# Patient Record
Sex: Female | Born: 1975 | Race: Black or African American | Hispanic: No | Marital: Single | Smoking: Former smoker
Health system: Southern US, Community
[De-identification: ages and names within clinical notes are randomized; demographics above are authoritative.]

## PROBLEM LIST (undated history)

## (undated) DIAGNOSIS — M797 Fibromyalgia: Secondary | ICD-10-CM

## (undated) DIAGNOSIS — F4310 Post-traumatic stress disorder, unspecified: Secondary | ICD-10-CM

## (undated) DIAGNOSIS — F419 Anxiety disorder, unspecified: Secondary | ICD-10-CM

## (undated) DIAGNOSIS — Z789 Other specified health status: Secondary | ICD-10-CM

## (undated) HISTORY — PX: BREAST SURGERY: SHX581

## (undated) HISTORY — PX: AUGMENTATION MAMMAPLASTY: SUR837

## (undated) HISTORY — DX: Other specified health status: Z78.9

## (undated) HISTORY — PX: TOOTH EXTRACTION: SUR596

## (undated) HISTORY — PX: PLACEMENT OF BREAST IMPLANTS: SHX6334

## (undated) HISTORY — DX: Anxiety disorder, unspecified: F41.9

## (undated) HISTORY — DX: Fibromyalgia: M79.7

---

## 2003-03-30 ENCOUNTER — Encounter: Admission: RE | Admit: 2003-03-30 | Discharge: 2003-03-30 | Payer: Self-pay | Admitting: Obstetrics & Gynecology

## 2004-03-02 ENCOUNTER — Encounter: Admission: RE | Admit: 2004-03-02 | Discharge: 2004-03-02 | Payer: Self-pay | Admitting: Obstetrics & Gynecology

## 2005-07-19 ENCOUNTER — Encounter: Admission: RE | Admit: 2005-07-19 | Discharge: 2005-07-19 | Payer: Self-pay | Admitting: General Surgery

## 2006-09-29 ENCOUNTER — Emergency Department (HOSPITAL_COMMUNITY): Admission: EM | Admit: 2006-09-29 | Discharge: 2006-09-29 | Payer: Self-pay | Admitting: Emergency Medicine

## 2012-08-10 ENCOUNTER — Encounter (HOSPITAL_COMMUNITY): Payer: Self-pay | Admitting: Emergency Medicine

## 2012-08-10 ENCOUNTER — Emergency Department (HOSPITAL_COMMUNITY): Payer: BC Managed Care – PPO

## 2012-08-10 ENCOUNTER — Inpatient Hospital Stay (HOSPITAL_COMMUNITY)
Admission: EM | Admit: 2012-08-10 | Discharge: 2012-08-13 | DRG: 089 | Disposition: A | Discharge status: Home / Self Care | Payer: BC Managed Care – PPO | Attending: Internal Medicine | Admitting: Internal Medicine

## 2012-08-10 DIAGNOSIS — E860 Dehydration: Secondary | ICD-10-CM | POA: Diagnosis present

## 2012-08-10 DIAGNOSIS — R55 Syncope and collapse: Secondary | ICD-10-CM | POA: Diagnosis present

## 2012-08-10 DIAGNOSIS — J189 Pneumonia, unspecified organism: Principal | ICD-10-CM

## 2012-08-10 DIAGNOSIS — I959 Hypotension, unspecified: Secondary | ICD-10-CM

## 2012-08-10 DIAGNOSIS — Z79899 Other long term (current) drug therapy: Secondary | ICD-10-CM

## 2012-08-10 DIAGNOSIS — E440 Moderate protein-calorie malnutrition: Secondary | ICD-10-CM | POA: Diagnosis present

## 2012-08-10 DIAGNOSIS — E876 Hypokalemia: Secondary | ICD-10-CM

## 2012-08-10 DIAGNOSIS — IMO0002 Reserved for concepts with insufficient information to code with codable children: Secondary | ICD-10-CM

## 2012-08-10 DIAGNOSIS — Z681 Body mass index (BMI) 19 or less, adult: Secondary | ICD-10-CM

## 2012-08-10 DIAGNOSIS — D649 Anemia, unspecified: Secondary | ICD-10-CM | POA: Diagnosis present

## 2012-08-10 DIAGNOSIS — F4310 Post-traumatic stress disorder, unspecified: Secondary | ICD-10-CM | POA: Diagnosis present

## 2012-08-10 HISTORY — DX: Post-traumatic stress disorder, unspecified: F43.10

## 2012-08-10 LAB — COMPREHENSIVE METABOLIC PANEL
ALT: 15 U/L (ref 0–35)
AST: 17 U/L (ref 0–37)
Albumin: 3.4 g/dL — ABNORMAL LOW (ref 3.5–5.2)
Alkaline Phosphatase: 63 U/L (ref 39–117)
BUN: 6 mg/dL (ref 6–23)
CO2: 27 mEq/L (ref 19–32)
Calcium: 9.3 mg/dL (ref 8.4–10.5)
Chloride: 99 mEq/L (ref 96–112)
Creatinine, Ser: 0.77 mg/dL (ref 0.50–1.10)
GFR calc Af Amer: >90 mL/min (ref >90)
GFR calc non Af Amer: >90 mL/min (ref >90)
Glucose, Bld: 122 mg/dL — ABNORMAL HIGH (ref 70–99)
Potassium: 3.2 mEq/L — ABNORMAL LOW (ref 3.5–5.1)
Sodium: 136 mEq/L (ref 135–145)
Total Bilirubin: 0.2 mg/dL — ABNORMAL LOW (ref 0.3–1.2)
Total Protein: 7.4 g/dL (ref 6.0–8.3)

## 2012-08-10 LAB — CBC WITH DIFFERENTIAL/PLATELET
Basophils Absolute: 0 10*3/uL (ref 0.0–0.1)
Basophils Relative: 0 % (ref 0–1)
Eosinophils Absolute: 0 10*3/uL (ref 0.0–0.7)
Eosinophils Relative: 0 % (ref 0–5)
HCT: 32.8 % — ABNORMAL LOW (ref 36.0–46.0)
Hemoglobin: 11.5 g/dL — ABNORMAL LOW (ref 12.0–15.0)
Lymphocytes Relative: 10 % — ABNORMAL LOW (ref 12–46)
Lymphs Abs: 0.8 10*3/uL (ref 0.7–4.0)
MCH: 29.3 pg (ref 26.0–34.0)
MCHC: 35.1 g/dL (ref 30.0–36.0)
MCV: 83.7 fL (ref 78.0–100.0)
Monocytes Absolute: 0.7 10*3/uL (ref 0.1–1.0)
Monocytes Relative: 9 % (ref 3–12)
Neutro Abs: 6.6 10*3/uL (ref 1.7–7.7)
Neutrophils Relative %: 81 % — ABNORMAL HIGH (ref 43–77)
Platelets: 268 10*3/uL (ref 150–400)
RBC: 3.92 MIL/uL (ref 3.87–5.11)
RDW: 12.2 % (ref 11.5–15.5)
WBC: 8.1 10*3/uL (ref 4.0–10.5)

## 2012-08-10 LAB — URINALYSIS, ROUTINE W REFLEX MICROSCOPIC
Bilirubin Urine: NEGATIVE
Glucose, UA: NEGATIVE mg/dL
Ketones, ur: NEGATIVE mg/dL
Nitrite: NEGATIVE
Protein, ur: 30 mg/dL — AB
Specific Gravity, Urine: 1.021 (ref 1.005–1.030)
Urobilinogen, UA: 1 mg/dL (ref 0.0–1.0)
pH: 7 (ref 5.0–8.0)

## 2012-08-10 LAB — URINE MICROSCOPIC-ADD ON

## 2012-08-10 LAB — D-DIMER, QUANTITATIVE: D-Dimer, Quant: 1.8 ug/mL-FEU — ABNORMAL HIGH (ref 0.00–0.48)

## 2012-08-10 LAB — TROPONIN I: Troponin I: <0.3 ng/mL (ref <0.30)

## 2012-08-10 LAB — PREGNANCY, URINE: Preg Test, Ur: NEGATIVE

## 2012-08-10 MED ORDER — SODIUM CHLORIDE 0.9 % IV BOLUS (SEPSIS)
1000.0000 mL | Freq: Once | INTRAVENOUS | Status: AC
  Administered 2012-08-10: 1000 mL via INTRAVENOUS

## 2012-08-10 MED ORDER — IBUPROFEN 800 MG PO TABS
800.0000 mg | ORAL_TABLET | Freq: Once | ORAL | Status: AC
  Administered 2012-08-10: 800 mg via ORAL
  Filled 2012-08-10: qty 1

## 2012-08-10 MED ORDER — DIPHENHYDRAMINE HCL 50 MG/ML IJ SOLN
25.0000 mg | Freq: Once | INTRAMUSCULAR | Status: AC
  Administered 2012-08-10: 25 mg via INTRAMUSCULAR
  Filled 2012-08-10: qty 1

## 2012-08-10 MED ORDER — AZITHROMYCIN 500 MG IV SOLR
500.0000 mg | INTRAVENOUS | Status: DC
  Administered 2012-08-10: 500 mg via INTRAVENOUS
  Filled 2012-08-10: qty 500

## 2012-08-10 MED ORDER — ONDANSETRON HCL 4 MG/2ML IJ SOLN
4.0000 mg | Freq: Once | INTRAMUSCULAR | Status: AC
  Administered 2012-08-10: 4 mg via INTRAVENOUS
  Filled 2012-08-10: qty 2

## 2012-08-10 MED ORDER — SODIUM CHLORIDE 0.9 % IV SOLN: INTRAVENOUS | Status: DC

## 2012-08-10 MED ORDER — CEFTRIAXONE SODIUM 250 MG IJ SOLR
250.0000 mg | Freq: Once | INTRAMUSCULAR | Status: AC
  Administered 2012-08-10: 250 mg via INTRAMUSCULAR
  Filled 2012-08-10: qty 250

## 2012-08-10 MED ORDER — IOHEXOL 350 MG/ML SOLN
100.0000 mL | Freq: Once | INTRAVENOUS | Status: AC | PRN
  Administered 2012-08-10: 100 mL via INTRAVENOUS

## 2012-08-10 MED ORDER — MORPHINE SULFATE 4 MG/ML IJ SOLN
4.0000 mg | Freq: Once | INTRAMUSCULAR | Status: AC
  Administered 2012-08-10: 4 mg via INTRAVENOUS
  Filled 2012-08-10: qty 1

## 2012-08-10 NOTE — ED Notes (Signed)
Pt ambulated out of the bathroom and RN and writer walked with a wheelchair behind pt, pt sts she was getting tired and could not walk anymore.  O2 sats at 97, HR at 113.

## 2012-08-10 NOTE — ED Notes (Signed)
Pt aware of the need for a urine sample. 

## 2012-08-10 NOTE — ED Notes (Addendum)
Patient c/o N/V, dry cough, fever/chills, syncopal episodes, anorexia, and right flank pain x 1 week.  Patient with h/o PTSD R/T 9/11 also reports anorexia that she hides from her husband.

## 2012-08-10 NOTE — ED Provider Notes (Signed)
Medical screening examination/treatment/procedure(s) were conducted as a shared visit with non-physician practitioner(s) and myself.  I personally evaluated the patient during the encounter  One week of chills, dry cough, fever nausea and sore throat. Decreased by mouth intake. Near syncopal episodes at home with generalized weakness. Bilateral flank pain from coughing. Lungs are clear to auscultation without wheezing. Abdomen is soft and nontender. No CVA tenderness. Infiltrates seen on chest x-ray. Treated as acquired pneumonia. Patient with limited to ambulate due to weakness. No hypoxia. Borderline blood pressures and heart rate. Will admit for observation.  Glynn Octave, MD 08/10/12 [phone]

## 2012-08-10 NOTE — ED Provider Notes (Signed)
History     CSN: [id number]  Arrival date & time 08/10/12  1710   First MD Initiated Contact with Patient 08/10/12 1717      Chief Complaint  Patient presents with  . Cough  . Loss of Consciousness  . Flank Pain    (Consider location/radiation/quality/duration/timing/severity/associated sxs/prior treatment) Patient is a 37 y.o. female presenting with cough, syncope, and flank pain. The history is provided by the patient and a relative. No language interpreter was used.  Cough Associated symptoms: chills, diaphoresis, fever and sore throat   Associated symptoms: no chest pain   Loss of Consciousness  Associated symptoms include diaphoresis, fever, nausea and vomiting ( dry heaves ). Pertinent negatives include chest pain and congestion.  Flank Pain Associated symptoms include chills, coughing, diaphoresis, a fever, nausea, a sore throat and vomiting ( dry heaves ). Pertinent negatives include no chest pain or congestion.  Pt is 36yo female c/o dry cough with mild sore throat and weakness x1wk.  Pt states she has not eaten much over the past 4 days due to nausea and reports 2-3 episodes of dry heaving today.  Pt was brought in by mother-in-law because pt had several episodes of near LOC. Pt is also c/o bilateral flank pain, R>L but denies abdominal pain or dysuria.  States she is currently on menstrual cycle.  Has tried children's Delsym and left over medication for strep throat.  States she has had 4 pills but no relief.  Denies hx of asthma or cardiac hx.  Denies hx of kidney stones.    Past Medical History  Diagnosis Date  . Post traumatic stress disorder (PTSD)     Past Surgical History  Procedure Laterality Date  . Cesarean section    . Tooth extraction      No family history on file.  History  Substance Use Topics  . Smoking status: Never Smoker   . Smokeless tobacco: Not on file  . Alcohol Use: Yes    OB History   Grav Para Term Preterm Abortions TAB SAB Ect Mult  Living                  Review of Systems  Constitutional: Positive for fever, chills, diaphoresis and appetite change ( anorexia x4 days).  HENT: Positive for sore throat. Negative for congestion, drooling, trouble swallowing and voice change.   Respiratory: Positive for cough.   Cardiovascular: Positive for syncope. Negative for chest pain.  Gastrointestinal: Positive for nausea and vomiting ( dry heaves ). Negative for diarrhea and constipation.  Genitourinary: Positive for flank pain.    Allergies  Review of patient's allergies indicates no known allergies.  Home Medications   Current Outpatient Rx  Name  Route  Sig  Dispense  Refill  . guaiFENesin (ROBITUSSIN) 100 MG/5ML liquid   Oral   Take 200 mg by mouth 3 (three) times daily as needed for cough.         Marland Kitchen PRESCRIPTION MEDICATION   Oral   Take 1 tablet by mouth daily. Antibiotic used for cough           BP 119/70  Pulse 113  Temp(Src) 100.8 F (38.2 C) (Oral)  Resp 14  SpO2 97%  LMP 08/08/2012  Physical Exam  Nursing note and vitals reviewed. Constitutional: She is oriented to person, place, and time. She appears well-developed and well-nourished.  HENT:  Head: Normocephalic and atraumatic.  Mouth/Throat: Oropharynx is clear and moist. No oropharyngeal exudate.  Eyes: Conjunctivae are  normal. No scleral icterus.  Neck: Normal range of motion. Neck supple. No JVD present. No tracheal deviation present. No thyromegaly present.  Cardiovascular: Regular rhythm and normal heart sounds.   Tachycardic   Pulmonary/Chest: Effort normal and breath sounds normal. No stridor. No respiratory distress. She has no wheezes. She has no rales. She exhibits tenderness ( left and right flank pain).  Abdominal: Soft. Bowel sounds are normal. She exhibits no distension and no mass. There is no tenderness. There is no rebound and no guarding.  Musculoskeletal: Normal range of motion. She exhibits no edema and no tenderness.   Lymphadenopathy:    She has no cervical adenopathy.  Neurological: She is alert and oriented to person, place, and time. She has normal reflexes.  Skin: Skin is warm and dry.    ED Course  Procedures (including critical care time)  Labs Reviewed  CBC WITH DIFFERENTIAL - Abnormal; Notable for the following:    Hemoglobin 11.5 (*)    HCT 32.8 (*)    Neutrophils Relative 81 (*)    Lymphocytes Relative 10 (*)    All other components within normal limits  COMPREHENSIVE METABOLIC PANEL - Abnormal; Notable for the following:    Potassium 3.2 (*)    Glucose, Bld 122 (*)    Albumin 3.4 (*)    Total Bilirubin 0.2 (*)    All other components within normal limits  URINALYSIS, ROUTINE W REFLEX MICROSCOPIC - Abnormal; Notable for the following:    APPearance CLOUDY (*)    Hgb urine dipstick LARGE (*)    Protein, ur 30 (*)    Leukocytes, UA SMALL (*)    All other components within normal limits  D-DIMER, QUANTITATIVE - Abnormal; Notable for the following:    D-Dimer, Quant 1.80 (*)    All other components within normal limits  TROPONIN I  PREGNANCY, URINE  URINE MICROSCOPIC-ADD ON   Dg Chest 2 View  08/10/2012  *RADIOLOGY REPORT*  Clinical Data: Cough and bodyaches.  CHEST - 2 VIEW  Comparison: None.  Findings: Midline trachea.  Normal heart size and mediastinal contours. No pleural effusion or pneumothorax.  Vague increased density projects of the left midlung on the frontal view.  This may correspond to a subtle increased density anterior to the left major fissure on the lateral view.  A nodular density projects over the right midlung and measures 1.5 cm. Numerous leads and wires project over the chest.  IMPRESSION:  1.  1.5 cm nodular density projecting over the right midlung. Suspicious for pulmonary nodule.  CT is pending. 2.  Vague increased density projecting over the left midlung on the frontal view.  Suspicious for left upper lobe infection. Asymmetric overlying breast tissue is felt  less likely.  This will also be further characterized with chest CT.   Original Report Authenticated By: Jeronimo Greaves, M.D.    Ct Angio Chest W/cm &/or Wo Cm  08/10/2012  *RADIOLOGY REPORT*  Clinical Data: Chest pain and cough.  Elevated D-dimer.  CT ANGIOGRAPHY CHEST  Technique:  Multidetector CT imaging of the chest using the standard protocol during bolus administration of intravenous contrast. Multiplanar reconstructed images including MIPs were obtained and reviewed to evaluate the vascular anatomy.  Contrast: OMNIPAQUE IOHEXOL 350 MG/ML SOLN  Comparison: Chest x-ray 08/10/2012  Findings: The chest wall is unremarkable.  The bony thorax is intact.  The heart is normal in size.  No pericardial effusion.  No mediastinal or hilar lymphadenopathy.  Small scattered nodes are noted.  The aorta is normal in caliber.  The esophagus is normal.  The pulmonary arterial tree is fairly well opacified.  No filling defects to suggest pulmonary emboli.  Examination of the lung parenchyma demonstrates bilateral airspace opacities, most likely pneumonia.  The left upper lobe is the most significant and there are prominent air bronchograms.  The other infiltrates are patchy.  No pleural effusion or pulmonary edema. No bronchiectasis.  The upper abdomen is unremarkable.  IMPRESSION:  1.  No CT findings for pulmonary embolism. 2.  Bilateral infiltrates most prominent in the left upper lobe. Recommend post-treatment follow-up chest x-ray to make sure these resolve.   Original Report Authenticated By: Rudie Meyer, M.D.      Date: 08/10/2012  Rate: 115  Rhythm: normal sinus rhythm  QRS Axis: normal  Intervals: normal  ST/T Wave abnormalities: normal  Conduction Disutrbances:none  Narrative Interpretation:   Old EKG Reviewed: none available    1. Left upper lobe pneumonia       MDM  Pt c/o dry cough associated with n/v and anorexia x1wk.  States she has felt very weak and c/o bilateral flank pain.  Has  tried medication she was given for strep throat and OTC children's cough syrup w/o relief.  Denies asthma or other medical problems.  LMP-currently on cycle. Not on birth control.  Pt nervous about giving urine sample due to being on menstrual cycle. Explained to pt importance of urine sample to help r/o bladder or kidney infection.  Will obtain labs and CXR.   Started on zofran, morphine, fluids.   Results: D-dimer 1.8, CBC- hgb 11.5, hct 32.8  ECG: nl  Preg: neg  8:30 PM Just went to check on pt, noticed 1 hive on right cheek.  Pt states she just returned from CT and had IV contrast.  No signs of anaphylaxis.  Breathing normally. NAD.   8:30 PM Explained to pt that she has pneumonia.  We will start antibiotic tx here and make sure she can ambulate w/o O2 dropping too low.  Explain to pt importance of eating and completely antibiotic tx.  Informed pt she also has anemia and should set up care with PCP for further evaluation and tx.   8:30 PM RN informed me pt was unable to walk more than 4 steps for ambulatory O2 due to weakness.  Pt never became hypoxic.  Advised RN to give pt juice and crackers.  Pt asked if husband could bring her dinner.  Advised pt she may have whatever she is able to keep down.  Will check on pt after she has received antibiotics, fluids, and some food.    9:46 PM Pt resting in bed with lights turned out stating she doesn't feel well.  BP is 87/65, c/o IV bothering her.  State she has had some crackers.  Sandwich is at bedside, pt states she will try to take a few small bites of the sandwich.    Will discuss with Dr. Manus Gunning and have RN recheck IV.   Will admit pt due to hypotension, tachycardia, and lethargy for fluids and IV antibiotics.   Discussed with pt who agreed to be admitted for additional tx.   Dr. Manus Gunning consulted Dr. Eben Burow who agreed to admit pt to Team 8-med surg.    Junius Finner, PA-C 08/10/12 2316

## 2012-08-10 NOTE — H&P (Signed)
[facility] AD[facility] NOTE   History and Physical  Meredith Wood BMW:[id number] DOB: [birth year] DOA: 08/10/2012  Referring physician: EDP/ Dr. Manus Gunning PCP: No primary provider on file.   Chief Complaint: cough  HPI: 37 year old woman with no significant past medical history except PTSD presents to the ED with chief complaint of cough and fever x 4 days. Patient reports having dry cough, associated with mild sore throat. She also reports loss of appetite, nausea but denies any vomiting. Patient also reports fevers but she did not take her temperature. Patient has headaches, body aches and has not been able to eat anything due to decreased appetite since last 4 days. Fevers have been associated with chills and patient has been basically covering herself with blankets all the time. Today she just felt that she needs help as she was not getting better and decided to come to the ER.  She also reports bilateral flank pain  that gets worse with coughing, denies any dysuria, abdominal pain, urgency or frequency. She reports having episodes of near syncope/dizziness due to weakness at home but denies loss of conscious or head trauma. Patient denies any sick contacts.  CT chest was found to be positive for upper lobe pneumonia. Patient does have increased neutrophils on CBC done in ER. Hospitalists team was asked to admit the patient for pneumonia.   Review of Systems:  Constitutional: Positive for fever, chills, diaphoresis and decrease in appetite change HENT: Positive for sore throat. Negative for congestion, drooling, trouble swallowing and voice change.  Respiratory: Positive for cough.  Cardiovascular: Positive for near syncope. Negative for chest pain.  Gastrointestinal: Positive for nausea . Negative for vomiting, diarrhea and constipation.  Genitourinary: Positive for flank pain.    Past Medical History  Diagnosis Date  . Post traumatic stress disorder (PTSD)     Past  Surgical History  Procedure Laterality Date  . Cesarean section    . Tooth extraction      Social History:  reports that she has never smoked. She does not have any smokeless tobacco history on file. She reports that  drinks alcohol. She reports that she does not use illicit drugs.  No Known Allergies  No family history on file. Noncontributory  Prior to Admission medications   Medication Sig Start Date End Date Taking? Authorizing Provider  guaiFENesin (ROBITUSSIN) 100 MG/5ML liquid Take 200 mg by mouth 3 (three) times daily as needed for cough.   Yes Historical Provider, MD  PRESCRIPTION MEDICATION Take 1 tablet by mouth daily. Antibiotic used for cough   Yes Historical Provider, MD   Physical Exam: Filed Vitals:   08/10/12 2017 08/10/12 2100 08/10/12 2200 08/10/12 2206  BP:  87/65 92/49 94/54   Pulse: 113 102 94   Temp:    98.8 F (37.1 C)  TempSrc:    Oral  Resp:  20 22 27   SpO2: 97% 99% 99% 99%   General: Patient is cachectic and has a sick look but otherwise alert and no acute distress.  HENT: Normocephalic and atraumatic, PERRLA, oropharynx is clear but dry mucous membranes with sunken eyes. No oropharyngeal exudate.  Neck: Normal range of motion. Neck supple. No JVD present.  Cardiovascular: Tachycardic,  Regular rhythm and normal heart sounds.  Pulmonary/Chest: Effort normal and breath sounds normal. No stridor, wheezes, or rales.  Abdominal: Soft, non distended non tender, positive bowel sounds, no rebound and no guarding, mildly tender to palpation in right and left flank Musculoskeletal: Normal range of motion.  No edema or cyanosis Lymphadenopathy: She has no cervical adenopathy.  Neurological: She is alert and oriented to person, place, and time. She has normal reflexes.  Skin: Skin is warm and dry.     Wt Readings from Last 3 Encounters:  No data found for Wt    Labs on Admission:  Basic Metabolic Panel:  Recent Labs Lab 08/10/12 1755  NA 136  K 3.2*   CL 99  CO2 27  GLUCOSE 122*  BUN 6  CREATININE 0.77  CALCIUM 9.3    Liver Function Tests:  Recent Labs Lab 08/10/12 1755  AST 17  ALT 15  ALKPHOS 63  BILITOT 0.2*  PROT 7.4  ALBUMIN 3.4*    CBC:  Recent Labs Lab 08/10/12 1755  WBC 8.1  NEUTROABS 6.6  HGB 11.5*  HCT 32.8*  MCV 83.7  PLT 268    Cardiac Enzymes:  Recent Labs Lab 08/10/12 1755  TROPONINI <0.30      Radiological Exams on Admission: Dg Chest 2 View  08/10/2012  *RADIOLOGY REPORT*  Clinical Data: Cough and bodyaches.  CHEST - 2 VIEW  Comparison: None.  Findings: Midline trachea.  Normal heart size and mediastinal contours. No pleural effusion or pneumothorax.  Vague increased density projects of the left midlung on the frontal view.  This may correspond to a subtle increased density anterior to the left major fissure on the lateral view.  A nodular density projects over the right midlung and measures 1.5 cm. Numerous leads and wires project over the chest.  IMPRESSION:  1.  1.5 cm nodular density projecting over the right midlung. Suspicious for pulmonary nodule.  CT is pending. 2.  Vague increased density projecting over the left midlung on the frontal view.  Suspicious for left upper lobe infection. Asymmetric overlying breast tissue is felt less likely.  This will also be further characterized with chest CT.   Original Report Authenticated By: Jeronimo Greaves, M.D.    Ct Angio Chest W/cm &/or Wo Cm  08/10/2012  *RADIOLOGY REPORT*  Clinical Data: Chest pain and cough.  Elevated D-dimer.  CT ANGIOGRAPHY CHEST  Technique:  Multidetector CT imaging of the chest using the standard protocol during bolus administration of intravenous contrast. Multiplanar reconstructed images including MIPs were obtained and reviewed to evaluate the vascular anatomy.  Contrast: OMNIPAQUE IOHEXOL 350 MG/ML SOLN  Comparison: Chest x-ray 08/10/2012  Findings: The chest wall is unremarkable.  The bony thorax is intact.  The  heart is normal in size.  No pericardial effusion.  No mediastinal or hilar lymphadenopathy.  Small scattered nodes are noted.  The aorta is normal in caliber.  The esophagus is normal.  The pulmonary arterial tree is fairly well opacified.  No filling defects to suggest pulmonary emboli.  Examination of the lung parenchyma demonstrates bilateral airspace opacities, most likely pneumonia.  The left upper lobe is the most significant and there are prominent air bronchograms.  The other infiltrates are patchy.  No pleural effusion or pulmonary edema. No bronchiectasis.  The upper abdomen is unremarkable.  IMPRESSION:  1.  No CT findings for pulmonary embolism. 2.  Bilateral infiltrates most prominent in the left upper lobe. Recommend post-treatment follow-up chest x-ray to make sure these resolve.   Original Report Authenticated By: Rudie Meyer, M.D.     EKG: sinus tachycardia, HR~110 bpm, no acute ST- T wave changes   Active Problems:   CAP (community acquired pneumonia)   Hypotension   Hypokalemia   Assessment/Plan # CAP/flu:  Patient presents with cough and bilateral flank pain. She was noted to have low grade temps with tachycardia  in the ED. Labs show normal WBC with mild left shift. CXR and CT shows bilateral infiltrates, most prominent in the LUL. PE was ruled out with negative CT- angio. Signs and symptoms are somewhat concerning for a flu(headaches, body ache and fatigue) - Admit to med- surg bed. - Obtain sputum cultures, urine for strep and legionella antigen - Treat with ceftriaxone and azithromycin - Would repeat a follow up CXR in 6- 8 weeks to make sure resolution of PNA - flu pcr - tamiflu  # Near syncope: likely 2/2 dehydration from decreased Po intake in the setting of infection( CAP)Patient reports having several episodes of near syncope. Her BP is running soft with systolic's in 90's.  - Orthostatics - Hydrate with IVF  # HYpokalemia: likely 2/2 decreased intake and  possible contraction alkalosis. - Replete K.  - BMET in AM  # Normocytic anemia: H and H stable. Continue to monitor.   # DVT: Lovenox  Code Status: Full Family Communication: Patient and family at the bedside were updated on the plan of care.  Disposition Plan/Anticipated LOS: Discharge home likely in 1-2 days.  Time spent: 70 minutes  Lars Mage, MD  [facility] Team 5  If 7PM-7AM, please contact night-coverage at [email or web address], password [facility] 08/10/2012, 10:52 PM

## 2012-08-10 NOTE — ED Notes (Signed)
Patient given PB and crackers, juice and water. Will con't to monitor.

## 2012-08-10 NOTE — ED Notes (Signed)
Report called, room is not ready, estimated 15 minutes. RN will call when ready

## 2012-08-11 DIAGNOSIS — I959 Hypotension, unspecified: Secondary | ICD-10-CM

## 2012-08-11 DIAGNOSIS — A419 Sepsis, unspecified organism: Secondary | ICD-10-CM

## 2012-08-11 DIAGNOSIS — E876 Hypokalemia: Secondary | ICD-10-CM

## 2012-08-11 LAB — BASIC METABOLIC PANEL
BUN: 5 mg/dL — ABNORMAL LOW (ref 6–23)
Chloride: 107 mEq/L (ref 96–112)
Creatinine, Ser: 0.8 mg/dL (ref 0.50–1.10)
GFR calc Af Amer: >90 mL/min (ref >90)
Glucose, Bld: 101 mg/dL — ABNORMAL HIGH (ref 70–99)

## 2012-08-11 LAB — STREP PNEUMONIAE URINARY ANTIGEN: Strep Pneumo Urinary Antigen: NEGATIVE

## 2012-08-11 LAB — EXPECTORATED SPUTUM ASSESSMENT W GRAM STAIN, RFLX TO RESP C: Special Requests: NORMAL

## 2012-08-11 LAB — LEGIONELLA ANTIGEN, URINE: Legionella Antigen, Urine: NEGATIVE

## 2012-08-11 LAB — INFLUENZA PANEL BY PCR (TYPE A & B): H1N1 flu by pcr: NOT DETECTED

## 2012-08-11 MED ORDER — IPRATROPIUM BROMIDE 0.02 % IN SOLN
0.5000 mg | Freq: Four times a day (QID) | RESPIRATORY_TRACT | Status: DC
  Administered 2012-08-11 – 2012-08-12 (×3): 0.5 mg via RESPIRATORY_TRACT
  Filled 2012-08-11 (×4): qty 2.5

## 2012-08-11 MED ORDER — ONDANSETRON HCL 4 MG PO TABS: 4.0000 mg | ORAL_TABLET | Freq: Four times a day (QID) | ORAL | Status: DC | PRN

## 2012-08-11 MED ORDER — ENSURE COMPLETE PO LIQD
237.0000 mL | Freq: Two times a day (BID) | ORAL | Status: DC
  Administered 2012-08-11 – 2012-08-12 (×3): 237 mL via ORAL

## 2012-08-11 MED ORDER — OSELTAMIVIR PHOSPHATE 75 MG PO CAPS
75.0000 mg | ORAL_CAPSULE | Freq: Two times a day (BID) | ORAL | Status: DC
  Administered 2012-08-11 (×2): 75 mg via ORAL
  Filled 2012-08-11 (×3): qty 1

## 2012-08-11 MED ORDER — ALBUTEROL SULFATE (5 MG/ML) 0.5% IN NEBU
2.5000 mg | INHALATION_SOLUTION | Freq: Four times a day (QID) | RESPIRATORY_TRACT | Status: DC
  Administered 2012-08-11 – 2012-08-12 (×3): 2.5 mg via RESPIRATORY_TRACT
  Filled 2012-08-11 (×4): qty 0.5

## 2012-08-11 MED ORDER — DEXTROSE 5 % IV SOLN
1.0000 g | Freq: Every day | INTRAVENOUS | Status: DC
  Administered 2012-08-11 – 2012-08-12 (×2): 1 g via INTRAVENOUS
  Filled 2012-08-11 (×3): qty 10

## 2012-08-11 MED ORDER — ENOXAPARIN SODIUM 40 MG/0.4ML ~~LOC~~ SOLN
40.0000 mg | SUBCUTANEOUS | Status: DC
  Administered 2012-08-11 – 2012-08-13 (×3): 40 mg via SUBCUTANEOUS
  Filled 2012-08-11 (×3): qty 0.4

## 2012-08-11 MED ORDER — AZITHROMYCIN 500 MG IV SOLR
500.0000 mg | Freq: Every day | INTRAVENOUS | Status: DC
  Administered 2012-08-11: 500 mg via INTRAVENOUS
  Filled 2012-08-11: qty 500

## 2012-08-11 MED ORDER — POTASSIUM CHLORIDE CRYS ER 20 MEQ PO TBCR
40.0000 meq | EXTENDED_RELEASE_TABLET | Freq: Two times a day (BID) | ORAL | Status: AC
  Administered 2012-08-11 (×2): 40 meq via ORAL
  Filled 2012-08-11 (×2): qty 2

## 2012-08-11 MED ORDER — ONDANSETRON HCL 4 MG/2ML IJ SOLN
4.0000 mg | Freq: Four times a day (QID) | INTRAMUSCULAR | Status: DC | PRN
  Administered 2012-08-11 – 2012-08-13 (×2): 4 mg via INTRAVENOUS
  Filled 2012-08-11 (×2): qty 2

## 2012-08-11 MED ORDER — IBUPROFEN 600 MG PO TABS
600.0000 mg | ORAL_TABLET | Freq: Three times a day (TID) | ORAL | Status: DC | PRN
  Administered 2012-08-11 – 2012-08-13 (×5): 600 mg via ORAL
  Filled 2012-08-11 (×8): qty 1

## 2012-08-11 MED ORDER — GUAIFENESIN-DM 100-10 MG/5ML PO SYRP
5.0000 mL | ORAL_SOLUTION | ORAL | Status: DC | PRN
  Administered 2012-08-11 – 2012-08-13 (×7): 5 mL via ORAL
  Filled 2012-08-11 (×7): qty 10

## 2012-08-11 MED ORDER — DM-GUAIFENESIN ER 30-600 MG PO TB12
1.0000 | ORAL_TABLET | Freq: Two times a day (BID) | ORAL | Status: DC
  Administered 2012-08-11 – 2012-08-13 (×5): 1 via ORAL
  Filled 2012-08-11 (×6): qty 1

## 2012-08-11 MED ORDER — ADULT MULTIVITAMIN W/MINERALS CH
1.0000 | ORAL_TABLET | Freq: Every day | ORAL | Status: DC
  Administered 2012-08-11 – 2012-08-13 (×3): 1 via ORAL
  Filled 2012-08-11 (×3): qty 1

## 2012-08-11 MED ORDER — SODIUM CHLORIDE 0.9 % IV SOLN
INTRAVENOUS | Status: DC
  Administered 2012-08-11 – 2012-08-13 (×8): via INTRAVENOUS

## 2012-08-11 NOTE — Progress Notes (Signed)
[facility] PROGRESS NOTE  Meredith Wood EAV:[id number] DOB: 12-19-1975 DOA: 08/10/2012 PCP: No primary provider on file.  Assessment/Plan: #CAP (community acquired pneumonia) with sepsis sydrome -will continue empiric rocephin and zithromax and continue volume resuscitation with ivf -influenza PCR neg, will D/Wood Tamiflu -strep pneumo antigen is neg, follow pending legionella antigen and sputum culture -will obtain HIV test -follow and obtain blood culture if further fever #Hypotension -likely secondary to #1 -see treat as above #Hypokalemia -likely secondary to decreased po intake -resolved # Near syncope: - likely 2/2 dehydration/orthostasis/hypotension from decreased Po intake in the setting of infectionPatient reports having several episodes of near syncope.  - Hydrating with IVF as above  # Normocytic anemia: H and H stable. Continue to monitor.    Code Status: full Family Communication: husband at bedside  Disposition Plan: to home when medically stable   Consultants:  none  Procedures:  none  Antibiotics:    HPI/Subjective: Still with cough and feels sore all over  Objective: Filed Vitals:   08/11/12 0104 08/11/12 0105 08/11/12 0108 08/11/12 0549  BP: 84/50 84/52 101/62 75/48  Pulse: 84 80 78 84  Temp:    97.4 F (36.3 Wood)  TempSrc:    Oral  Resp:    16  Height:      Weight:      SpO2:    100%    Intake/Output Summary (Last 24 hours) at 08/11/12 1109 Last data filed at 08/11/12 1022  Gross per 24 hour  Intake      0 ml  Output    550 ml  Net   -550 ml   Filed Weights   08/11/12 0103  Weight: 45.7 kg (100 lb 12 oz)    Exam:   General: thin appearing young female in NAD  Cardiovascular: RRR  Respiratory: CTAB  Abdomen: soft +BS NT/ND  Extremities: No cyanosis, no edema   Data Reviewed: Basic Metabolic Panel:  Recent Labs Lab 08/10/12 1755 08/11/12 0442  NA 136 139  K 3.2* 3.6  CL 99 107  CO2 27 27  GLUCOSE  122* 101*  BUN 6 5*  CREATININE 0.77 0.80  CALCIUM 9.3 8.1*   Liver Function Tests:  Recent Labs Lab 08/10/12 1755  AST 17  ALT 15  ALKPHOS 63  BILITOT 0.2*  PROT 7.4  ALBUMIN 3.4*   No results found for this basename: LIPASE, AMYLASE,  in the last 168 hours No results found for this basename: AMMONIA,  in the last 168 hours CBC:  Recent Labs Lab 08/10/12 1755  WBC 8.1  NEUTROABS 6.6  HGB 11.5*  HCT 32.8*  MCV 83.7  PLT 268   Cardiac Enzymes:  Recent Labs Lab 08/10/12 1755  TROPONINI <0.30   BNP (last 3 results) No results found for this basename: PROBNP,  in the last 8760 hours CBG: No results found for this basename: GLUCAP,  in the last 168 hours  Recent Results (from the past 240 hour(s))  CULTURE, EXPECTORATED SPUTUM-ASSESSMENT     Status: None   Collection Time    08/11/12 10:15 AM      Result Value Range Status   Specimen Description SPUTUM   Final   Special Requests Normal   Final   Sputum evaluation     Final   Value: THIS SPECIMEN IS ACCEPTABLE. RESPIRATORY CULTURE REPORT TO FOLLOW.   Report Status 08/11/2012 FINAL   Final     Studies: Dg Chest 2 View  08/10/2012  *RADIOLOGY REPORT*  Clinical Data: Cough and bodyaches.  CHEST - 2 VIEW  Comparison: None.  Findings: Midline trachea.  Normal heart size and mediastinal contours. No pleural effusion or pneumothorax.  Vague increased density projects of the left midlung on the frontal view.  This may correspond to a subtle increased density anterior to the left major fissure on the lateral view.  A nodular density projects over the right midlung and measures 1.5 cm. Numerous leads and wires project over the chest.  IMPRESSION:  1.  1.5 cm nodular density projecting over the right midlung. Suspicious for pulmonary nodule.  CT is pending. 2.  Vague increased density projecting over the left midlung on the frontal view.  Suspicious for left upper lobe infection. Asymmetric overlying breast tissue is felt less  likely.  This will also be further characterized with chest CT.   Original Report Authenticated By: Meredith Wood, M.D.    Ct Angio Chest W/cm &/or Wo Cm  08/10/2012  *RADIOLOGY REPORT*  Clinical Data: Chest pain and cough.  Elevated D-dimer.  CT ANGIOGRAPHY CHEST  Technique:  Multidetector CT imaging of the chest using the standard protocol during bolus administration of intravenous contrast. Multiplanar reconstructed images including MIPs were obtained and reviewed to evaluate the vascular anatomy.  Contrast: OMNIPAQUE IOHEXOL 350 MG/ML SOLN  Comparison: Chest x-ray 08/10/2012  Findings: The chest wall is unremarkable.  The bony thorax is intact.  The heart is normal in size.  No pericardial effusion.  No mediastinal or hilar lymphadenopathy.  Small scattered nodes are noted.  The aorta is normal in caliber.  The esophagus is normal.  The pulmonary arterial tree is fairly well opacified.  No filling defects to suggest pulmonary emboli.  Examination of the lung parenchyma demonstrates bilateral airspace opacities, most likely pneumonia.  The left upper lobe is the most significant and there are prominent air bronchograms.  The other infiltrates are patchy.  No pleural effusion or pulmonary edema. No bronchiectasis.  The upper abdomen is unremarkable.  IMPRESSION:  1.  No CT findings for pulmonary embolism. 2.  Bilateral infiltrates most prominent in the left upper lobe. Recommend post-treatment follow-up chest x-ray to make sure these resolve.   Original Report Authenticated By: Meredith Wood, M.D.     Scheduled Meds: . albuterol  2.5 mg Nebulization Q6H  . azithromycin  500 mg Intravenous QHS  . cefTRIAXone (ROCEPHIN)  IV  1 g Intravenous QHS  . enoxaparin (LOVENOX) injection  40 mg Subcutaneous Q24H  . ipratropium  0.5 mg Nebulization Q6H  . oseltamivir  75 mg Oral BID   Continuous Infusions: . sodium chloride 150 mL/hr at 08/11/12 4098    Active Problems:   CAP (community acquired  pneumonia)   Hypotension   Hypokalemia    Time spent: 35    Meredith Wood  [facility] Pager [phone]. If 7PM-7AM, please contact night-coverage at [email or web address], password [facility] 08/11/2012, 11:09 AM  LOS: 1 day

## 2012-08-11 NOTE — Progress Notes (Signed)
INITIAL NUTRITION ASSESSMENT  Pt meets criteria for severe MALNUTRITION in the context of acute illness as evidenced by <50% estimated energy intake with 7.4% weight loss in the past week per pt report.  DOCUMENTATION CODES Per approved criteria  -Severe malnutrition in the context of acute illness or injury -Underweight   INTERVENTION: - Vanilla Ensure Complete BID - Discussed importance of balanced diet for general health. Encouraged pt to consume adequate protein intake and discussed potential meal/snack options. Pt expressed understanding and ordered additional protein for lunch. - Multivitamin 1 tablet PO daily - Will continue to monitor   NUTRITION DIAGNOSIS: Predicted suboptimal energy intake related to history of inadequate oral intake r/t picky eating as evidenced by pt statement.   Goal: 1. Resolution of nausea 2. Pt to consume >90% of meals/supplements  Monitor:  Weights, labs, intake nausea  Reason for Assessment: Nutrition risk   37 y.o. female  Admitting Dx: Cough  ASSESSMENT: Pt admitted with cough and fever for 4 days with associated loss of appetite and nausea. Met with pt who reports she has only been able to consume Gatorade and water for the past week causing an 8 pound unintended weight loss. Pt reports before then she was mainly only eating 1 meal/day due to being a picky eater and states her other meals are "spotty". Pt states her boyfriend does not think she eats enough but she thinks she does and states she likes to snack on fruit throughout the day. Pt c/o still having nausea but less dry heaving. Pt was able to eat some toast and yogurt this morning.   Height: Ht Readings from Last 1 Encounters:  08/11/12 5\' 4"  (1.626 m)    Weight: Wt Readings from Last 1 Encounters:  08/11/12 100 lb 12 oz (45.7 kg)    Ideal Body Weight: 120 lb  % Ideal Body Weight: 83  Wt Readings from Last 10 Encounters:  08/11/12 100 lb 12 oz (45.7 kg)    Usual Body  Weight: 108 lb  % Usual Body Weight: 92  BMI:  Body mass index is 17.29 kg/(m^2). Underweight  Estimated Nutritional Needs: Kcal: 1350-1600 Protein: 55-70g Fluid: 1.3-1.6L/day  Skin: Intact   Diet Order: General  EDUCATION NEEDS: -No education needs identified at this time   Intake/Output Summary (Last 24 hours) at 08/11/12 1059 Last data filed at 08/11/12 1022  Gross per 24 hour  Intake      0 ml  Output    550 ml  Net   -550 ml    Last BM: 4/21  Labs:   Recent Labs Lab 08/10/12 1755 08/11/12 0442  NA 136 139  K 3.2* 3.6  CL 99 107  CO2 27 27  BUN 6 5*  CREATININE 0.77 0.80  CALCIUM 9.3 8.1*  GLUCOSE 122* 101*    CBG (last 3)  No results found for this basename: GLUCAP,  in the last 72 hours  Scheduled Meds: . albuterol  2.5 mg Nebulization Q6H  . azithromycin  500 mg Intravenous QHS  . cefTRIAXone (ROCEPHIN)  IV  1 g Intravenous QHS  . enoxaparin (LOVENOX) injection  40 mg Subcutaneous Q24H  . ipratropium  0.5 mg Nebulization Q6H  . oseltamivir  75 mg Oral BID    Continuous Infusions: . sodium chloride 150 mL/hr at 08/11/12 5621    Past Medical History  Diagnosis Date  . Post traumatic stress disorder (PTSD)     Past Surgical History  Procedure Laterality Date  . Cesarean section    .  Tooth extraction      Levon Hedger MS, RD, LDN [phone] Pager [phone] After Hours Pager

## 2012-08-12 ENCOUNTER — Encounter (HOSPITAL_COMMUNITY): Payer: Self-pay

## 2012-08-12 LAB — CBC
Hemoglobin: 9.2 g/dL — ABNORMAL LOW (ref 12.0–15.0)
MCH: 29.4 pg (ref 26.0–34.0)
RBC: 3.13 MIL/uL — ABNORMAL LOW (ref 3.87–5.11)
WBC: 5.8 10*3/uL (ref 4.0–10.5)

## 2012-08-12 MED ORDER — IPRATROPIUM BROMIDE 0.02 % IN SOLN: 0.5000 mg | Freq: Four times a day (QID) | RESPIRATORY_TRACT | Status: DC | PRN

## 2012-08-12 MED ORDER — AZITHROMYCIN 500 MG PO TABS
500.0000 mg | ORAL_TABLET | Freq: Every day | ORAL | Status: DC
  Administered 2012-08-12: 500 mg via ORAL
  Filled 2012-08-12 (×2): qty 1

## 2012-08-12 MED ORDER — ALBUTEROL SULFATE (5 MG/ML) 0.5% IN NEBU: 2.5000 mg | INHALATION_SOLUTION | Freq: Four times a day (QID) | RESPIRATORY_TRACT | Status: DC | PRN

## 2012-08-12 NOTE — Progress Notes (Signed)
PHARMACIST - PHYSICIAN COMMUNICATION [facility] CONCERNING: Antibiotic IV to Oral Route Change Policy  RECOMMENDATION: This patient is receiving Zithromax by the intravenous route.  Based on criteria approved by the Pharmacy and Therapeutics Committee, the antibiotic(s) is/are being converted to the equivalent oral dose form(s).   DESCRIPTION: These criteria include:  Patient being treated for a respiratory tract infection, urinary tract infection, or cellulitis  The patient is not neutropenic and does not exhibit a GI malabsorption state  The patient is eating (either orally or via tube) and/or has been taking other orally administered medications for a least 24 hours  The patient is improving clinically and has a Tmax < 100.5  If you have questions about this conversion, please contact the Pharmacy Department  []   [phone] )  Meredith Wood []   [phone] )  Meredith Wood  []   [phone] )  [facility] [x]   [phone])  [facility]    Hessie Knows, PharmD, [place] Pager [phone] 08/12/2012 9:16 AM

## 2012-08-12 NOTE — Progress Notes (Deleted)
This patient is receiving IV Protonix. Based on criteria approved by the Pharmacy and Therapeutics Committee, this medication is being converted to the equivalent oral dose form. These criteria include:   . The patient is eating (either orally or per tube) and/or has been taking other orally administered medications for at least 24 hours.  . This patient has no evidence of active gastrointestinal bleeding or impaired GI absorption (gastrectomy, short bowel, patient on TNA or NPO).   If you have questions about this conversion, please contact the pharmacy department.  Berkley Harvey, [facility] 08/12/2012 9:15 AM

## 2012-08-12 NOTE — Progress Notes (Signed)
[facility] PROGRESS NOTE  [name]:[id number] DOB: 01/31/1976 DOA: 08/10/2012 PCP: No primary provider on file.  Assessment/Plan: #CAP (community acquired pneumonia) with sepsis sydrome -will continue empiric rocephin and zithromax and continue ivf BP soft but improved -influenza PCR neg, will D/C Tamiflu -strep pneumo antigen is neg, follow pending legionella antigen and sputum culture - HIV test neg -blood culture obtained last pm pending #Hypotension -likely secondary to #1 -improving with ivf #Hypokalemia -likely secondary to decreased po intake -resolved # Near syncope: - likely 2/2 dehydration/orthostasis/hypotension from decreased Po intake in the setting of infectionPatient reports having several episodes of near syncope.  - Hydrating with IVF as above  # Normocytic anemia: H and H stable. Continue to monitor.    Code Status: full Family Communication: husband at bedside  Disposition Plan: to home when medically stable   Consultants:  none  Procedures:  none  Antibiotics:    HPI/Subjective: Still with cough, states soreness improving  Objective: Filed Vitals:   08/11/12 2111 08/12/12 0142 08/12/12 0656 08/12/12 1613  BP: 95/60  100/60 103/70  Pulse: 82  96 77  Temp: 98.4 F (36.9 C)  98.3 F (36.8 C) 98.9 F (37.2 C)  TempSrc: Oral  Oral Oral  Resp: 18 18 18 16   Height:      Weight:      SpO2: 100%  99% 100%    Intake/Output Summary (Last 24 hours) at 08/12/12 2113 Last data filed at 08/12/12 1616  Gross per 24 hour  Intake   4390 ml  Output      0 ml  Net   4390 ml   Filed Weights   08/11/12 0103  Weight: 45.7 kg (100 lb 12 oz)    Exam:   General: thin appearing young female in NAD  Cardiovascular: RRR  Respiratory: CTAB  Abdomen: soft +BS NT/ND  Extremities: No cyanosis, no edema   Data Reviewed: Basic Metabolic Panel:  Recent Labs Lab 08/10/12 1755 08/11/12 0442  NA 136 139  K 3.2* 3.6  CL  99 107  CO2 27 27  GLUCOSE 122* 101*  BUN 6 5*  CREATININE 0.77 0.80  CALCIUM 9.3 8.1*   Liver Function Tests:  Recent Labs Lab 08/10/12 1755  AST 17  ALT 15  ALKPHOS 63  BILITOT 0.2*  PROT 7.4  ALBUMIN 3.4*   No results found for this basename: LIPASE, AMYLASE,  in the last 168 hours No results found for this basename: AMMONIA,  in the last 168 hours CBC:  Recent Labs Lab 08/10/12 1755 08/12/12 0445  WBC 8.1 5.8  NEUTROABS 6.6  --   HGB 11.5* 9.2*  HCT 32.8* 26.6*  MCV 83.7 85.0  PLT 268 246   Cardiac Enzymes:  Recent Labs Lab 08/10/12 1755  TROPONINI <0.30   BNP (last 3 results) No results found for this basename: PROBNP,  in the last 8760 hours CBG: No results found for this basename: GLUCAP,  in the last 168 hours  Recent Results (from the past 240 hour(s))  CULTURE, EXPECTORATED SPUTUM-ASSESSMENT     Status: None   Collection Time    08/11/12 10:15 AM      Result Value Range Status   Specimen Description SPUTUM   Final   Special Requests Normal   Final   Sputum evaluation     Final   Value: THIS SPECIMEN IS ACCEPTABLE. RESPIRATORY CULTURE REPORT TO FOLLOW.   Report Status 08/11/2012 FINAL   Final  CULTURE, RESPIRATORY (NON-EXPECTORATED)  Status: None   Collection Time    08/11/12 10:15 AM      Result Value Range Status   Specimen Description SPUTUM   Final   Special Requests NONE   Final   Gram Stain     Final   Value: FEW WBC PRESENT,BOTH PMN AND MONONUCLEAR     RARE SQUAMOUS EPITHELIAL CELLS PRESENT     RARE GRAM POSITIVE COCCI     IN PAIRS   Culture NORMAL OROPHARYNGEAL FLORA   Final   Report Status PENDING   Incomplete     Studies: No results found.  Scheduled Meds: . azithromycin  500 mg Oral QHS  . cefTRIAXone (ROCEPHIN)  IV  1 g Intravenous QHS  . dextromethorphan-guaiFENesin  1 tablet Oral BID  . enoxaparin (LOVENOX) injection  40 mg Subcutaneous Q24H  . feeding supplement  237 mL Oral BID BM  . multivitamin with minerals   1 tablet Oral Daily   Continuous Infusions: . sodium chloride 150 mL/hr at 08/12/12 1729    Active Problems:   CAP (community acquired pneumonia)   Hypotension   Hypokalemia    Time spent: 25    [facility]  [facility] Pager [phone]. If 7PM-7AM, please contact night-coverage at [email or web address], password [facility] 08/12/2012, 9:13 PM  LOS: 2 days

## 2012-08-13 ENCOUNTER — Encounter (HOSPITAL_COMMUNITY): Payer: Self-pay | Admitting: *Deleted

## 2012-08-13 ENCOUNTER — Emergency Department (HOSPITAL_COMMUNITY): Payer: BC Managed Care – PPO

## 2012-08-13 ENCOUNTER — Emergency Department (HOSPITAL_COMMUNITY)
Admission: EM | Admit: 2012-08-13 | Discharge: 2012-08-13 | Disposition: A | Discharge status: Home / Self Care | Payer: BC Managed Care – PPO | Attending: Emergency Medicine | Admitting: Emergency Medicine

## 2012-08-13 DIAGNOSIS — R05 Cough: Secondary | ICD-10-CM | POA: Insufficient documentation

## 2012-08-13 DIAGNOSIS — Z8701 Personal history of pneumonia (recurrent): Secondary | ICD-10-CM | POA: Insufficient documentation

## 2012-08-13 DIAGNOSIS — R0989 Other specified symptoms and signs involving the circulatory and respiratory systems: Secondary | ICD-10-CM | POA: Insufficient documentation

## 2012-08-13 DIAGNOSIS — R0609 Other forms of dyspnea: Secondary | ICD-10-CM | POA: Insufficient documentation

## 2012-08-13 DIAGNOSIS — R0600 Dyspnea, unspecified: Secondary | ICD-10-CM

## 2012-08-13 DIAGNOSIS — Z8659 Personal history of other mental and behavioral disorders: Secondary | ICD-10-CM | POA: Insufficient documentation

## 2012-08-13 DIAGNOSIS — F411 Generalized anxiety disorder: Secondary | ICD-10-CM | POA: Insufficient documentation

## 2012-08-13 DIAGNOSIS — R059 Cough, unspecified: Secondary | ICD-10-CM | POA: Insufficient documentation

## 2012-08-13 LAB — CULTURE, RESPIRATORY W GRAM STAIN

## 2012-08-13 MED ORDER — AZITHROMYCIN 500 MG PO TABS: 500.0000 mg | ORAL_TABLET | Freq: Every day | ORAL | Status: DC

## 2012-08-13 MED ORDER — CEFUROXIME AXETIL 500 MG PO TABS: 500.0000 mg | ORAL_TABLET | Freq: Two times a day (BID) | ORAL | Status: DC

## 2012-08-13 MED ORDER — ALBUTEROL SULFATE (5 MG/ML) 0.5% IN NEBU
5.0000 mg | INHALATION_SOLUTION | Freq: Once | RESPIRATORY_TRACT | Status: AC
  Administered 2012-08-13: 5 mg via RESPIRATORY_TRACT
  Filled 2012-08-13: qty 1

## 2012-08-13 MED ORDER — DM-GUAIFENESIN ER 30-600 MG PO TB12: 1.0000 | ORAL_TABLET | Freq: Two times a day (BID) | ORAL | Status: DC

## 2012-08-13 MED ORDER — ALBUTEROL SULFATE HFA 108 (90 BASE) MCG/ACT IN AERS
1.0000 | INHALATION_SPRAY | Freq: Once | RESPIRATORY_TRACT | Status: AC
  Administered 2012-08-13: 2 via RESPIRATORY_TRACT
  Filled 2012-08-13: qty 6.7

## 2012-08-13 NOTE — ED Provider Notes (Signed)
History     CSN: [id number]  Arrival date & time 08/13/12  2115   First MD Initiated Contact with Patient 08/13/12 2143      Chief Complaint  Patient presents with  . Shortness of Breath    (Consider location/radiation/quality/duration/timing/severity/associated sxs/prior treatment) HPI The patient presents on the day of discharge from an admission for community acquired pneumonia with concern for ongoing cough, dyspnea. She states that since discharge she was briefly well, but upon arrival prolonged episodes of coughing, sensation of dyspnea no relief with anything. She denies new fever, vomiting, diarrhea, confusion, disorientation, significant chest pain not associated with coughing. She was in her usual state of health prior to the onset of cough that prompted evaluation in one week ago, resulting in diagnosis of pneumonia.  Past Medical History  Diagnosis Date  . Post traumatic stress disorder (PTSD)     Past Surgical History  Procedure Laterality Date  . Cesarean section    . Tooth extraction      No family history on file.  History  Substance Use Topics  . Smoking status: Never Smoker   . Smokeless tobacco: Not on file  . Alcohol Use: Yes    OB History   Grav Para Term Preterm Abortions TAB SAB Ect Mult Living                  Review of Systems  All other systems reviewed and are negative.    Allergies  Review of patient's allergies indicates no known allergies.  Home Medications   Current Outpatient Rx  Name  Route  Sig  Dispense  Refill  . azithromycin (ZITHROMAX) 500 MG tablet   Oral   Take 1 tablet (500 mg total) by mouth daily.   2 tablet   0     For days.   . Biotin 1 MG CAPS   Oral   Take 1 capsule by mouth daily.         . cefUROXime (CEFTIN) 500 MG tablet   Oral   Take 1 tablet (500 mg total) by mouth 2 (two) times daily.   12 tablet   0     For days   . dextromethorphan-guaiFENesin (MUCINEX DM) 30-600 MG per  12 hr tablet   Oral   Take 1 tablet by mouth 2 (two) times daily.   30 tablet   0   . guaiFENesin (ROBITUSSIN) 100 MG/5ML liquid   Oral   Take 200 mg by mouth 3 (three) times daily as needed for cough.         . Multiple Vitamin (MULTIVITAMIN WITH MINERALS) TABS   Oral   Take 1 tablet by mouth daily.           BP 118/71  Pulse 84  Temp(Src) 99.1 F (37.3 C) (Oral)  Resp 20  Wt 108 lb 2 oz (49.045 kg)  BMI 18.55 kg/m2  SpO2 99%  LMP 08/08/2012  Physical Exam  Nursing note and vitals reviewed. Constitutional: She is oriented to person, place, and time. She appears well-developed and well-nourished. No distress.  HENT:  Head: Normocephalic and atraumatic.  Eyes: Conjunctivae and EOM are normal.  Cardiovascular: Normal rate and regular rhythm.   Pulmonary/Chest: Effort normal and breath sounds normal. No stridor. No respiratory distress.  Abdominal: She exhibits no distension.  Musculoskeletal: She exhibits no edema.  Neurological: She is alert and oriented to person, place, and time. No cranial nerve deficit.  Skin: Skin is warm  and dry.  Psychiatric: Her mood appears anxious.    ED Course  Procedures (including critical care time)  Labs Reviewed - No data to display No results found.   No diagnosis found.  Pulse ox 99% room air normal  I interpreted the x-ray, demonstrated to the patient.  MDM  This proves will female presents with concerns of ongoing cough, dyspnea.  Notably, the patient has a recent diagnosis of community-acquired pneumonia, and on my exam is in no distress, toxic, tachypnea, tachycardia, fever.  Patient has no risk factors for PE, though she was hospitalized recently, she was not immobilized. The patient's x-ray does not demonstrate progression of pneumonia, and absent vital sign abnormalities she received a breathing treatment, with some improvement in her condition, and was discharged in stable condition.        Gerhard Munch,  MD 08/13/12 2246

## 2012-08-13 NOTE — Progress Notes (Addendum)
Patient is a 37 year old African-American married (11 years) female with one child, a 35 year old female.  She went into a significant state of negative emotions and asked for the Chaplain.  By the time the Chaplain arrived, the patient was feeling better.  We discussed the various stresses in her life, why this ground-swell of emotion may have happened, things she may consider as she recovers and re-frames her activities and her less-than-optimal amount of time she takes to sleep (roughly 6 hours per night, by schedule).   Husband arrived half way through or so.  The couple seem to have a very good relationship.   We also discussed what meaning can be gleaned from the patient's pneumonia.  They are church attenders at a predominantly [employer] congregation near their home in [place].  The patient has attended church all her life.  The husband became acquainted with church through his wife when they were dating.  He has some difficulty with "church hypocrisy," and we discussed ways to deal with the issues he (and she) sees.  We also discussed ways of listening to the spirit.  While we spent quite a while together, the patient was very encouraged and felt stronger and more grounded to face some decisions that were imminent re: cutting back her activities and involvement associated with church.  We discussed her need to mean "no" when she says "no" and not get roped/pressured into doing what she doesn't want to do or has no time to do it.  The patient mentioned she had to process what was said during the session and integrate the principles and pathways into her life.  Patient has concerns regarding her father's cancer (lives locally).  She is very zealous about spending whatevr time she can when he is able.  She is unaware of any timeline for discharge, but would like to go home.  Rema Jasmine, Chaplain Pager: [phone]

## 2012-08-13 NOTE — Discharge Summary (Signed)
Physician Discharge Summary  Meredith Wood:[id number] DOB: Jan 22, 1976 DOA: 08/10/2012  PCP: No primary provider on file.  Admit date: 08/10/2012 Discharge date: 08/13/2012  Time spent: >30 minutes  Recommendations for Outpatient Follow-up:      Follow-up Information   Follow up with [facility] In 1 week. (Call to establish care with primary care provider. Also mention to primary care you were shown to have anemia in ER today.  Likely need iron supplements.   )    Contact information:   Address: [address], [place], [place] [address] [phone]     Discharge Diagnoses:  Active Problems:   CAP (community acquired pneumonia)   Hypotension   Hypokalemia   Discharge Condition: Improved/stable  Diet recommendation: Regular  Filed Weights   08/11/12 0103  Weight: 45.7 kg (100 lb 12 oz)    History of present illness:  HPI: 37 year old woman with no significant past medical history except PTSD presents to the ED with chief complaint of cough and fever x 4 days. Patient reports having dry cough, associated with mild sore throat. She also reports loss of appetite, nausea but denies any vomiting. Patient also reports fevers but she did not take her temperature. Patient has headaches, body aches and has not been able to eat anything due to decreased appetite since last 4 days. Fevers have been associated with chills and patient has been basically covering herself with blankets all the time. Today she just felt that she needs help as she was not getting better and decided to come to the ER.  She also reports bilateral flank pain that gets worse with coughing, denies any dysuria, abdominal pain, urgency or frequency. She reports having episodes of near syncope/dizziness due to weakness at home but denies loss of conscious or head trauma.  Patient denies any sick contacts.  CT chest was found to be positive for upper lobe pneumonia. Patient does have increased neutrophils on  CBC done in ER. Hospitalists team was asked to admit the patient for pneumonia.      Hospital Course:  CAP (community acquired pneumonia) with sepsis sydrome  -Upon admission the patient had a chest x-ray done which showed a 1.5 cm nodular density projecting over the right lung suspicious for pulmonary nodule and if the increased density projecting over the left midlung suspicious for left upper lobe infection. A CT angiogram of the chest was done and showed PE, bilateral infiltrates most prominent in the left upper lobe were noted. She was started on empiric antibiotics-Rocephin and Zithromax on admission. She had fevers and blood cultures were obtained. The blood cultures to date show no growth. She defervesced. She is clinically improved and the hypotension that she had initially has resolved. -influenza PCR was done on admission and it came back negative and the Tamiflu she was empirically started on was discontinued.  -strep pneumo antigen is neg, legionella antigen came back-negative and HIV test neg. #Hypotension  -likely secondary to #1, resolved and patient clinically improved as above.  #Hypokalemia  -likely secondary to decreased po intake  -Potassium was replaced, resolved  # Near syncope:  - likely 2/2 dehydration/orthostasis/hypotension from decreased Po intake in the setting of infectionPatient reports having several episodes of near syncope.  - Pt was hydrated with IV fluids and she's not had any further episodes  # Normocytic anemia: H and H stable. Follow up outpatient with PCP.     Procedures:  none  Consultations:  none  Discharge Exam:  Filed Vitals:   08/12/12 0656 08/12/12 1613 08/12/12 2157 08/13/12 0639  BP: 100/60 103/70 106/68 114/71  Pulse: 96 77 82 91  Temp: 98.3 F (36.8 C) 98.9 F (37.2 C) 98.9 F (37.2 C) 98.6 F (37 C)  TempSrc: Oral Oral Oral Oral  Resp: 18 16 18 20   Height:      Weight:      SpO2: 99% 100% 98% 100%   Exam:  General:  thin appearing young female in NAD  Cardiovascular: RRR  Respiratory: CTAB  Abdomen: soft +BS NT/ND  Extremities: No cyanosis, no edema   Discharge Instructions  Discharge Orders   Future Orders Complete By Expires     Diet general  As directed     Increase activity slowly  As directed         Medication List    STOP taking these medications       PRESCRIPTION MEDICATION      TAKE these medications       azithromycin 500 MG tablet  Commonly known as:  ZITHROMAX  Take 1 tablet (500 mg total) by mouth daily.     cefUROXime 500 MG tablet  Commonly known as:  CEFTIN  Take 1 tablet (500 mg total) by mouth 2 (two) times daily.     dextromethorphan-guaiFENesin 30-600 MG per 12 hr tablet  Commonly known as:  MUCINEX DM  Take 1 tablet by mouth 2 (two) times daily.     guaiFENesin 100 MG/5ML liquid  Commonly known as:  ROBITUSSIN  Take 200 mg by mouth 3 (three) times daily as needed for cough.           Follow-up Information   Follow up with [facility] In 1 week. (Call to establish care with primary care provider. Also mention to primary care you were shown to have anemia in ER today.  Likely need iron supplements.   )    Contact information:   Address: [address], [place], [place] [address] [phone]       The results of significant diagnostics from this hospitalization (including imaging, microbiology, ancillary and laboratory) are listed below for reference.    Significant Diagnostic Studies: Dg Chest 2 View  08/10/2012  *RADIOLOGY REPORT*  Clinical Data: Cough and bodyaches.  CHEST - 2 VIEW  Comparison: None.  Findings: Midline trachea.  Normal heart size and mediastinal contours. No pleural effusion or pneumothorax.  Vague increased density projects of the left midlung on the frontal view.  This may correspond to a subtle increased density anterior to the left major fissure on the lateral view.  A nodular density projects over the right midlung and  measures 1.5 cm. Numerous leads and wires project over the chest.  IMPRESSION:  1.  1.5 cm nodular density projecting over the right midlung. Suspicious for pulmonary nodule.  CT is pending. 2.  Vague increased density projecting over the left midlung on the frontal view.  Suspicious for left upper lobe infection. Asymmetric overlying breast tissue is felt less likely.  This will also be further characterized with chest CT.   Original Report Authenticated By: Jeronimo Greaves, M.D.    Ct Angio Chest W/cm &/or Wo Cm  08/10/2012  *RADIOLOGY REPORT*  Clinical Data: Chest pain and cough.  Elevated D-dimer.  CT ANGIOGRAPHY CHEST  Technique:  Multidetector CT imaging of the chest using the standard protocol during bolus administration of intravenous contrast. Multiplanar reconstructed images including MIPs were obtained and reviewed to evaluate the vascular  anatomy.  Contrast: OMNIPAQUE IOHEXOL 350 MG/ML SOLN  Comparison: Chest x-ray 08/10/2012  Findings: The chest wall is unremarkable.  The bony thorax is intact.  The heart is normal in size.  No pericardial effusion.  No mediastinal or hilar lymphadenopathy.  Small scattered nodes are noted.  The aorta is normal in caliber.  The esophagus is normal.  The pulmonary arterial tree is fairly well opacified.  No filling defects to suggest pulmonary emboli.  Examination of the lung parenchyma demonstrates bilateral airspace opacities, most likely pneumonia.  The left upper lobe is the most significant and there are prominent air bronchograms.  The other infiltrates are patchy.  No pleural effusion or pulmonary edema. No bronchiectasis.  The upper abdomen is unremarkable.  IMPRESSION:  1.  No CT findings for pulmonary embolism. 2.  Bilateral infiltrates most prominent in the left upper lobe. Recommend post-treatment follow-up chest x-ray to make sure these resolve.   Original Report Authenticated By: Rudie Meyer, M.D.     Microbiology: Recent Results (from the past  240 hour(s))  CULTURE, EXPECTORATED SPUTUM-ASSESSMENT     Status: None   Collection Time    08/11/12 10:15 AM      Result Value Range Status   Specimen Description SPUTUM   Final   Special Requests Normal   Final   Sputum evaluation     Final   Value: THIS SPECIMEN IS ACCEPTABLE. RESPIRATORY CULTURE REPORT TO FOLLOW.   Report Status 08/11/2012 FINAL   Final  CULTURE, RESPIRATORY (NON-EXPECTORATED)     Status: None   Collection Time    08/11/12 10:15 AM      Result Value Range Status   Specimen Description SPUTUM   Final   Special Requests NONE   Final   Gram Stain     Final   Value: FEW WBC PRESENT,BOTH PMN AND MONONUCLEAR     RARE SQUAMOUS EPITHELIAL CELLS PRESENT     RARE GRAM POSITIVE COCCI     IN PAIRS   Culture NORMAL OROPHARYNGEAL FLORA   Final   Report Status 08/13/2012 FINAL   Final  CULTURE, BLOOD (ROUTINE X 2)     Status: None   Collection Time    08/11/12 10:36 PM      Result Value Range Status   Specimen Description BLOOD LEFT ARM   Final   Special Requests BOTTLES DRAWN AEROBIC AND ANAEROBIC 5CC   Final   Culture  Setup Time 08/12/2012 06:52   Final   Culture     Final   Value:        BLOOD CULTURE RECEIVED NO GROWTH TO DATE CULTURE WILL BE HELD FOR 5 DAYS BEFORE ISSUING A FINAL NEGATIVE REPORT   Report Status PENDING   Incomplete  CULTURE, BLOOD (ROUTINE X 2)     Status: None   Collection Time    08/11/12 10:40 PM      Result Value Range Status   Specimen Description BLOOD LEFT HAND   Final   Special Requests BOTTLES DRAWN AEROBIC ONLY 3CC   Final   Culture  Setup Time 08/12/2012 06:52   Final   Culture     Final   Value:        BLOOD CULTURE RECEIVED NO GROWTH TO DATE CULTURE WILL BE HELD FOR 5 DAYS BEFORE ISSUING A FINAL NEGATIVE REPORT   Report Status PENDING   Incomplete     Labs: Basic Metabolic Panel:  Recent Labs Lab 08/10/12 1755 08/11/12 0442  NA  136 139  K 3.2* 3.6  CL 99 107  CO2 27 27  GLUCOSE 122* 101*  BUN 6 5*  CREATININE 0.77  0.80  CALCIUM 9.3 8.1*   Liver Function Tests:  Recent Labs Lab 08/10/12 1755  AST 17  ALT 15  ALKPHOS 63  BILITOT 0.2*  PROT 7.4  ALBUMIN 3.4*   No results found for this basename: LIPASE, AMYLASE,  in the last 168 hours No results found for this basename: AMMONIA,  in the last 168 hours CBC:  Recent Labs Lab 08/10/12 1755 08/12/12 0445  WBC 8.1 5.8  NEUTROABS 6.6  --   HGB 11.5* 9.2*  HCT 32.8* 26.6*  MCV 83.7 85.0  PLT 268 246   Cardiac Enzymes:  Recent Labs Lab 08/10/12 1755  TROPONINI <0.30   BNP: BNP (last 3 results) No results found for this basename: PROBNP,  in the last 8760 hours CBG: No results found for this basename: GLUCAP,  in the last 168 hours     Signed:  Kela Millin  [facility] 08/13/2012, 12:57 PM

## 2012-08-13 NOTE — ED Notes (Signed)
Patient transported to X-ray 

## 2012-08-18 LAB — CULTURE, BLOOD (ROUTINE X 2): Culture: NO GROWTH

## 2013-02-27 ENCOUNTER — Inpatient Hospital Stay (HOSPITAL_COMMUNITY)
Admission: AD | Admit: 2013-02-27 | Discharge: 2013-02-27 | Disposition: A | Discharge status: Home / Self Care | Payer: BC Managed Care – PPO | Source: Ambulatory Visit | Attending: Family Medicine | Admitting: Family Medicine

## 2013-02-27 DIAGNOSIS — N898 Other specified noninflammatory disorders of vagina: Secondary | ICD-10-CM

## 2013-02-27 DIAGNOSIS — Z711 Person with feared health complaint in whom no diagnosis is made: Secondary | ICD-10-CM

## 2013-02-27 DIAGNOSIS — IMO0002 Reserved for concepts with insufficient information to code with codable children: Secondary | ICD-10-CM | POA: Insufficient documentation

## 2013-02-27 LAB — URINALYSIS, ROUTINE W REFLEX MICROSCOPIC
Glucose, UA: NEGATIVE mg/dL
Hgb urine dipstick: NEGATIVE
Leukocytes, UA: NEGATIVE
Protein, ur: NEGATIVE mg/dL
Specific Gravity, Urine: >1.03 — ABNORMAL HIGH (ref 1.005–1.030)
pH: 6 (ref 5.0–8.0)

## 2013-02-27 LAB — WET PREP, GENITAL
Clue Cells Wet Prep HPF POC: NONE SEEN
Trich, Wet Prep: NONE SEEN
WBC, Wet Prep HPF POC: NONE SEEN

## 2013-02-27 LAB — POCT PREGNANCY, URINE: Preg Test, Ur: NEGATIVE
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
## 2013-02-27 NOTE — MAU Provider Note (Signed)
Chart reviewed and agree with management and plan.  

## 2014-09-08 LAB — HM PAP SMEAR: HM Pap smear: NEGATIVE

## 2014-09-28 DIAGNOSIS — F329 Major depressive disorder, single episode, unspecified: Secondary | ICD-10-CM

## 2014-09-28 DIAGNOSIS — Z720 Tobacco use: Secondary | ICD-10-CM

## 2014-09-28 DIAGNOSIS — F419 Anxiety disorder, unspecified: Secondary | ICD-10-CM

## 2014-09-28 DIAGNOSIS — F32A Depression, unspecified: Secondary | ICD-10-CM | POA: Insufficient documentation

## 2014-09-29 ENCOUNTER — Encounter: Payer: Self-pay | Admitting: Obstetrics and Gynecology

## 2014-09-29 ENCOUNTER — Other Ambulatory Visit: Payer: Self-pay

## 2014-09-29 ENCOUNTER — Ambulatory Visit (INDEPENDENT_AMBULATORY_CARE_PROVIDER_SITE_OTHER): Payer: BLUE CROSS/BLUE SHIELD | Admitting: Obstetrics and Gynecology

## 2014-09-29 ENCOUNTER — Other Ambulatory Visit: Payer: BLUE CROSS/BLUE SHIELD

## 2014-09-29 VITALS — BP 97/59 | HR 79 | Ht 64.0 in | Wt 109.8 lb

## 2014-09-29 DIAGNOSIS — F329 Major depressive disorder, single episode, unspecified: Secondary | ICD-10-CM

## 2014-09-29 DIAGNOSIS — F32A Depression, unspecified: Secondary | ICD-10-CM

## 2014-09-29 DIAGNOSIS — Z01419 Encounter for gynecological examination (general) (routine) without abnormal findings: Secondary | ICD-10-CM

## 2014-09-29 DIAGNOSIS — F419 Anxiety disorder, unspecified: Secondary | ICD-10-CM | POA: Diagnosis not present

## 2014-09-29 MED ORDER — ALPRAZOLAM 0.5 MG PO TABS: 0.5000 mg | ORAL_TABLET | Freq: Three times a day (TID) | ORAL | Status: AC | PRN

## 2014-09-29 NOTE — Addendum Note (Signed)
Addended by: Richardson ChiquitoRAVIS, ASHLEY M on: 09/29/2014 05:08 PM   Modules accepted: Orders

## 2014-09-29 NOTE — Progress Notes (Signed)
This encounter was created in error - please disregard.

## 2014-09-29 NOTE — Progress Notes (Signed)
Subjective:     Meredith Wood is a 39 y.o. [id number] female who presents for follow up of depression. Current symptoms include anhedonia, depressed mood, feelings of worthlessness/guilt, hopelessness, hypersomnia, insomnia, weight loss and extreme anxiety. Symptoms have been unchanged since last visit as she notes she did not start antidpressant medication due to worrying about side effects (notes that she read a pamphlet and online info and afterwards had a panic attack). Patient denies impaired memory, psychomotor agitation, recurrent thoughts of death and suicidal thoughts without plan. Previous treatment includes: none. She complains of the following side effects from the treatment: none as patient has not initiated medications.   The following portions of the patient's history were reviewed and updated as appropriate: allergies, current medications, past family history, past medical history, past social history, past surgical history and problem list.  Review of Systems A comprehensive review of systems was negative except for: those noted in HPI.     Objective:    BP 97/59 mmHg  Pulse 79  Ht 5\' 4"  (1.626 m)  Wt 49.805 kg (109 lb 12.8 oz)  BMI 18.84 kg/m2  LMP  (LMP Unknown)  General:  alert and cooperative  Affect & Behavior:  full facial expressions, good grooming, normal speech pattern and content and normal thought patterns poor insight      Assessment:    Depression, unchanged      Plan:    Medications: benzodiazepines Xanax (to take x 1 week prior to initiation of antidepressant) and Wellbutrin. Recommended counseling again, however patient still declines as she notes that it has not been helping in the past .. Instructed patient to contact office or on-call physician promptly should condition worsen or any new symptoms appear and provided on-call telephone numbers. IF THE PATIENT HAS ANY SUICIDAL OR HOMICIDAL IDEATIONS, CALL THE OFFICE, DISCUSS WITH A SUPPORT MEMBER, OR GO  TO THE ER IMMEDIATELY. Patient was agreeable with this plan. Follow up: 4 weeks. Spent 15 minutes (>50% of visit) discussing the risks of anxiety disorder, panic attacks, sleep disturbance and depression, the  pathophysiology, etiology, risks, and principles of treatment.

## 2014-09-29 NOTE — Patient Instructions (Signed)
Instructed patient to contact office or on-call physician promptly should condition worsen or any new symptoms appear and provided on-call telephone numbers. IF THE PATIENT HAS ANY SUICIDAL OR HOMICIDAL IDEATIONS, CALL THE OFFICE, DISCUSS WITH A SUPPORT MEMBER, OR GO TO THE ER IMMEDIATELY. .  Follow up: 4 weeks.

## 2014-10-01 ENCOUNTER — Telehealth: Payer: Self-pay

## 2014-10-01 LAB — HEMOGLOBIN A1C
Est. average glucose Bld gHb Est-mCnc: 111 mg/dL
Hgb A1c MFr Bld: 5.5 % (ref 4.8–5.6)

## 2014-10-01 LAB — BASIC METABOLIC PANEL
BUN/Creatinine Ratio: 12 (ref 8–20)
BUN: 13 mg/dL (ref 6–20)
CHLORIDE: 103 mmol/L (ref 97–108)
CO2: 25 mmol/L (ref 18–29)
CREATININE: 1.05 mg/dL — AB (ref 0.57–1.00)
Calcium: 10.2 mg/dL (ref 8.7–10.2)
GFR calc Af Amer: 79 mL/min/{1.73_m2} (ref >59)
GFR, EST NON AFRICAN AMERICAN: 68 mL/min/{1.73_m2} (ref >59)
Glucose: 76 mg/dL (ref 65–99)
Potassium: 4.2 mmol/L (ref 3.5–5.2)
Sodium: 142 mmol/L (ref 134–144)

## 2014-10-01 LAB — THYROID PANEL WITH TSH
FREE THYROXINE INDEX: 2.7 (ref 1.2–4.9)
T3 Uptake Ratio: 28 % (ref 24–39)
T4, Total: 9.5 ug/dL (ref 4.5–12.0)
TSH: 1.3 u[IU]/mL (ref 0.450–4.500)

## 2014-10-01 LAB — HEPATITIS B SURFACE ANTIGEN: HEP B S AG: NEGATIVE

## 2014-10-01 LAB — PLEASE NOTE

## 2014-10-01 LAB — VITAMIN D 25 HYDROXY (VIT D DEFICIENCY, FRACTURES): Vit D, 25-Hydroxy: 24.5 ng/mL — ABNORMAL LOW (ref 30.0–100.0)

## 2014-10-01 LAB — LIPID PANEL
Chol/HDL Ratio: 2.2 ratio units (ref 0.0–4.4)
Cholesterol, Total: 159 mg/dL (ref 100–199)
HDL: 72 mg/dL (ref >39)
LDL Calculated: 78 mg/dL (ref 0–99)
Triglycerides: 44 mg/dL (ref 0–149)
VLDL Cholesterol Cal: 9 mg/dL (ref 5–40)

## 2014-10-01 LAB — HEMATOCRIT: Hematocrit: 41.3 % (ref 34.0–46.6)

## 2014-10-01 LAB — RPR: RPR Ser Ql: NONREACTIVE

## 2014-10-01 LAB — HIV ANTIBODY (ROUTINE TESTING W REFLEX): HIV Screen 4th Generation wRfx: NONREACTIVE

## 2014-10-01 NOTE — Telephone Encounter (Signed)
-----   Message from Hildred Laser, MD sent at 10/01/2014  7:49 AM EDT ----- Please inform patient of normal annual lab and STD screening.  Is Vit D insufficient, needs to take a daily OTC Vit D supplemement daily.

## 2014-10-01 NOTE — Telephone Encounter (Signed)
Called pt advised her of normal lab results and the need to begin taking vitamin d supplement per Dr.Cherry.

## 2014-10-26 ENCOUNTER — Ambulatory Visit: Payer: Self-pay

## 2014-10-27 ENCOUNTER — Ambulatory Visit: Payer: BLUE CROSS/BLUE SHIELD | Admitting: Obstetrics and Gynecology

## 2014-12-09 ENCOUNTER — Encounter (HOSPITAL_COMMUNITY): Payer: Self-pay | Admitting: *Deleted

## 2014-12-09 ENCOUNTER — Emergency Department (HOSPITAL_COMMUNITY)
Admission: EM | Admit: 2014-12-09 | Discharge: 2014-12-09 | Disposition: A | Discharge status: Home / Self Care | Payer: BLUE CROSS/BLUE SHIELD | Attending: Emergency Medicine | Admitting: Emergency Medicine

## 2014-12-09 DIAGNOSIS — R1310 Dysphagia, unspecified: Secondary | ICD-10-CM | POA: Insufficient documentation

## 2014-12-09 DIAGNOSIS — R220 Localized swelling, mass and lump, head: Secondary | ICD-10-CM | POA: Diagnosis not present

## 2014-12-09 DIAGNOSIS — Z720 Tobacco use: Secondary | ICD-10-CM | POA: Insufficient documentation

## 2014-12-09 DIAGNOSIS — T7840XA Allergy, unspecified, initial encounter: Secondary | ICD-10-CM | POA: Insufficient documentation

## 2014-12-09 DIAGNOSIS — Z8739 Personal history of other diseases of the musculoskeletal system and connective tissue: Secondary | ICD-10-CM | POA: Insufficient documentation

## 2014-12-09 DIAGNOSIS — R0602 Shortness of breath: Secondary | ICD-10-CM | POA: Insufficient documentation

## 2014-12-09 DIAGNOSIS — R252 Cramp and spasm: Secondary | ICD-10-CM | POA: Insufficient documentation

## 2014-12-09 DIAGNOSIS — L509 Urticaria, unspecified: Secondary | ICD-10-CM | POA: Diagnosis not present

## 2014-12-09 DIAGNOSIS — Z8659 Personal history of other mental and behavioral disorders: Secondary | ICD-10-CM | POA: Diagnosis not present

## 2014-12-09 DIAGNOSIS — R0981 Nasal congestion: Secondary | ICD-10-CM | POA: Diagnosis not present

## 2014-12-09 DIAGNOSIS — Z793 Long term (current) use of hormonal contraceptives: Secondary | ICD-10-CM | POA: Insufficient documentation

## 2014-12-09 DIAGNOSIS — R061 Stridor: Secondary | ICD-10-CM | POA: Insufficient documentation

## 2014-12-09 MED ORDER — EPINEPHRINE 0.3 MG/0.3ML IJ SOAJ: 0.3000 mg | Freq: Once | INTRAMUSCULAR | Status: DC

## 2014-12-09 MED ORDER — DIPHENHYDRAMINE HCL 50 MG/ML IJ SOLN
50.0000 mg | Freq: Once | INTRAMUSCULAR | Status: AC
  Administered 2014-12-09: 50 mg via INTRAVENOUS

## 2014-12-09 MED ORDER — DIPHENHYDRAMINE HCL 25 MG PO TABS: 25.0000 mg | ORAL_TABLET | Freq: Three times a day (TID) | ORAL | Status: AC | PRN

## 2014-12-09 MED ORDER — DEXAMETHASONE SODIUM PHOSPHATE 10 MG/ML IJ SOLN
10.0000 mg | Freq: Once | INTRAMUSCULAR | Status: AC
  Administered 2014-12-09: 10 mg via INTRAVENOUS

## 2014-12-09 MED ORDER — METHYLPREDNISOLONE 4 MG PO TBPK: ORAL_TABLET | ORAL | Status: DC

## 2014-12-09 MED ORDER — FAMOTIDINE 20 MG PO TABS: 20.0000 mg | ORAL_TABLET | Freq: Two times a day (BID) | ORAL | Status: DC

## 2014-12-09 MED ORDER — EPINEPHRINE 0.3 MG/0.3ML IJ SOAJ
0.3000 mg | Freq: Once | INTRAMUSCULAR | Status: AC
  Administered 2014-12-09: 0.3 mg via INTRAMUSCULAR

## 2014-12-09 MED ORDER — FAMOTIDINE IN NACL 20-0.9 MG/50ML-% IV SOLN
20.0000 mg | Freq: Once | INTRAVENOUS | Status: AC
  Administered 2014-12-09: 20 mg via INTRAVENOUS
  Filled 2014-12-09: qty 50

## 2014-12-09 MED ORDER — EPINEPHRINE 0.3 MG/0.3ML IJ SOAJ
INTRAMUSCULAR | Status: AC
  Administered 2014-12-09: 0.3 mg via INTRAMUSCULAR
  Filled 2014-12-09: qty 0.3

## 2014-12-09 MED ORDER — DEXAMETHASONE SODIUM PHOSPHATE 10 MG/ML IJ SOLN
INTRAMUSCULAR | Status: DC
Start: 2014-12-09 — End: 2014-12-10
  Filled 2014-12-09: qty 1

## 2014-12-09 MED ORDER — LORAZEPAM 2 MG/ML IJ SOLN
1.0000 mg | Freq: Once | INTRAMUSCULAR | Status: AC
  Administered 2014-12-09: 1 mg via INTRAVENOUS
  Filled 2014-12-09: qty 1

## 2014-12-09 MED ORDER — DIPHENHYDRAMINE HCL 50 MG/ML IJ SOLN
INTRAMUSCULAR | Status: AC
  Filled 2014-12-09: qty 1

## 2014-12-09 NOTE — Discharge Instructions (Signed)
°Emergency Department Resource Guide °1) Find a Doctor and Pay Out of Pocket °Although you won't have to find out who is covered by your insurance plan, it is a good idea to ask around and get recommendations. You will then need to call the office and see if the doctor you have chosen will accept you as a new patient and what types of options they offer for patients who are self-pay. Some doctors offer discounts or will set up payment plans for their patients who do not have insurance, but you will need to ask so you aren't surprised when you get to your appointment. ° °2) Contact Your Local Health Department °Not all health departments have doctors that can see patients for sick visits, but many do, so it is worth a call to see if yours does. If you don't know where your local health department is, you can check in your phone book. The CDC also has a tool to help you locate your state's health department, and many state websites also have listings of all of their local health departments. ° °3) Find a Walk-in Clinic °If your illness is not likely to be very severe or complicated, you may want to try a walk in clinic. These are popping up all over the country in pharmacies, drugstores, and shopping centers. They're usually staffed by nurse practitioners or physician assistants that have been trained to treat common illnesses and complaints. They're usually fairly quick and inexpensive. However, if you have serious medical issues or chronic medical problems, these are probably not your best option. ° °No Primary Care Doctor: °- Call [facility] at  832-8000 - they can help you locate a primary care doctor that  accepts your insurance, provides certain services, etc. °- Physician Referral Service- 1-800-533-3463 ° °Chronic Pain Problems: °Organization         Address  Phone   Notes  °[facility]  (336) 297-2271 Patients need to be referred by their primary care doctor.  ° °Medication  Assistance: °Organization         Address  Phone   Notes  °[place] [facility] [address] °[place], [place] [address] (336) 641-8030 --Must be a resident of [place] °-- Must have NO insurance coverage whatsoever (no Medicaid/ Medicare, etc.) °-- The pt. MUST have a primary care doctor that directs their care regularly and follows them in the community °  °[facility]  (866) 331-1348   °[employer]  (888) 892-1162   ° °Agencies that provide inexpensive medical care: °Organization         Address  Phone   Notes  °[facility]  (336) 832-8035   °[facility]    (336) 832-7272   °[facility] [address] °[place], [place] [address] (336) 832-4777   °[facility] [address], °[place] (336) 271-4999   °[facility]    (336) 373-0678   °[facility]    (336) 272-1050   °[facility] ° [address], [place] Phone:  (336) 832-4444, Fax:  (336) 832-4440 Hours of Operation:  9 am - 6 pm, M-F.  Also accepts Medicaid/Medicare and self-pay.  °[facility] ° [address], [place] Phone: (336) 832-3150, Fax: (336) 832-3151. Hours of Operation:  8:30 am - 5:30 pm, M-F.  Also accepts Medicaid and self-pay.  °[facility] [place] [address], [place] Phone: (336) 878-6027   °[facility] [address], [place]  (336)723-1848, Ext. 123 Mondays & Thursdays: 7-9 AM.  First 15 patients are seen on a first come, first serve basis. °  ° °Medicaid-accepting [place] Providers: ° °Organization         Address  Phone   Notes  °[facility] [address], [place] (336) 641-2100 Also accepts self-pay patients.  °[facility] [address], [place] ° (336) 856-9996   °[facility] [address], [place]  (336) 288-8857   °[facility] [address] [place] [address], [place] (336) 299-7000   °Veita Bland [address], [place]  ° (336) 373-1557 Only accepts [place] Access Medicaid patients after they have their name applied to their card.  ° °Self-Pay (no insurance) in [place]: ° °Organization         Address  Phone   Notes  °Sickle Cell Patients, [facility] [address], [place] (336) 832-1970   °[facility] [address], [place] (336) 832-4400   °[facility] ° [address], [place] (336) 992-4800   °[facility]/Dr. Osei-Bonsu ° [address] [place] [address], [place] or [address], [place] (336) 841-8500 Phone number for both [place] and [place] locations is the same.  °[facility] [address], [place] (336) 299-0000   °[facility] [address] [place] [address], [place] or [address] (336) 852-7530 °(336) 878-2260   °[facility] [address], [place] (336) 350-1642, phone; (336) 294-5005, fax Sees patients 1st and 3rd Saturday of every month.  Must not qualify for public or private insurance (i.e. Medicaid, Medicare, [place] Health Choice, Veterans' Benefits) • Household income should be no more than 200% of the poverty level •The clinic cannot treat you if you are pregnant or think you are pregnant • Sexually transmitted diseases are not treated at the clinic.  ° ° °Dental Care: °Organization         Address  Phone  Notes  °[place] [facility] [address],  (336) 641-6152 Accepts children up to age 21 who are enrolled in Medicaid or [place] Health Choice; pregnant women with a Medicaid card; and children who have applied for Medicaid or [place] Health Choice, but were declined, whose parents can pay a reduced fee at time of service.  °[place]  [facility] [place]  [address], [place] (336) 641-7733 Accepts children up to age 21 who are enrolled in Medicaid or [place] Health Choice; pregnant women with a Medicaid card; and children who have applied for Medicaid or [place] Health Choice, but were declined, whose parents can pay a reduced fee at time of service.  °[facility] ° [address],  (336) 641-4533 Patients are seen by appointment only. Walk-ins are not accepted. [facility] will see patients 18 years of age and older. °Monday - Tuesday (8am-5pm) °Most Wednesdays (8:30-5pm) °$30 per visit, cash only  °[facility] ° [address], [place] (336) 641-4533 Patients are seen by appointment only. Walk-ins are not accepted. [facility] will see patients 18 years of age and older. °One   Wednesday Evening (Monthly: Volunteer Based).  $30 per visit, cash only  °[facility]  (919) 537-3737 for adults; Children under age 4, call Graduate Pediatric Dentistry at (919) [phone]. Children aged 4-14, please call (919) [phone] to request a pediatric application. ° Dental services are provided in all areas of dental care including fillings, crowns and bridges, complete and partial dentures, implants, gum treatment, root canals, and extractions. Preventive care is also provided. Treatment is provided to both adults and children. °Patients are selected via a lottery and there is often a waiting list. °  °[facility] [address], °[place] ° (336) 763-8833 [email or web address] °  °[facility] [address], [place], [place] (336)723-1848, Ext. 123 Second and Fourth Thursday of each month, opens at 6:30 AM; Clinic ends at 9 AM.  Patients are seen on a first-come first-served basis, and a limited number are seen during each clinic.  ° °[facility] ° [address], [place], [place] (336) 723-7904    Eligibility Requirements °You must have lived in [place], [place], or [place] counties for at least the last three months. °  You cannot be eligible for state or federal sponsored healthcare insurance, including Veterans Administration, Medicaid, or Medicare. °  You generally cannot be eligible for healthcare insurance through your employer.  °  How to apply: °Eligibility screenings are held every Tuesday and Wednesday afternoon from 1:00 pm until 4:00 pm. You do not need an appointment for the interview!  °[facility] [address], [place], [place] 336-631-2330   °[facility] [facility]  336-342-8273   °[facility]  336-703-3100   ° [facility]  336-570-6415   ° °Behavioral Health Resources in the Community: °Intensive Outpatient Programs °Organization         Address  Phone  Notes  °[facility] [address], [place], [place] 336-878-6098   °[facility] [address], [place], [place] 336-832-9800   °[facility] [address], [place], [place] ° 336-882-2125   °[facility] [address],  °[place], [place] 1-800-853-5163 or 336-641-4981   °Substance Abuse Resources °Organization         Address  Phone  Notes  °[facility]  336-882-2125   °[facility]  336-784-9470   °[facility]  336-285-9073   °[facility]  336-845-3988   °Residential & Outpatient Substance Abuse Program  1-800-659-3381   °Psychological Services °Organization         Address  Phone  Notes  ° Health  336- 832-9600   °[facility]  336- 378-7881   °[facility] [address], [place] 1-800-853-5163 or 336-641-4981   ° °Mobile Crisis Teams °Organization         Address  Phone  Notes  °[facility], Mobile Crisis Care Unit  1-877-626-1772   °Assertive °[facility] ° [address]  [place], [place] 336-834-9664   °Sharon DeEsch [address] °[place] 336-554-5454   ° °Self-Help/Support Groups °Organization         Address  Phone             Notes  °[facility]  - variety of support groups  336- 373-1402 Call for more information  °Narcotics Anonymous (NA), [facility] [address], °[place]  2 meetings at this location  ° °  Residential Treatment Programs °Organization         Address  Phone  Notes  °[facility] [address],    °[place]  1-866-801-8205   °[facility] ° [address], [place], [place] 704-293-8524   °[facility] [address], [place] 336-845-3988 Admissions: 8am-3pm M-F  °[facility] [address]    °[place], [place] 336-841-1104   °[facility] [address], [place], [place] 336-379-7146   °[facility] [address]  °[place], [place] 336-285-9073   °[facility] Intensive Outpatient [address], [place], [place] 336-852-3033   °[facility] [address]  °[place], [place] 1-877-615-2722 or 336-784-9470   °[facility] [address] [place], [place] 336-227-7417 Accepts Medicaid  °[facility] [address]  °[place] 1-800-659-3381 Substance Abuse/Addiction Treatment  ° °[place] Behavioral Health Resources °Organization         Address  Phone  Notes  °[facility]  (888) 581-9988   °Julie Brannon, PhD [address] [place], [place]   (336) 349-5553 or (336) 951-0000   °[facility]   [address] °[place], [place] (336) 349-4454   °[facility] [address], [place], [place] (336) 342-8316 Insurance/Medicaid/sponsorship through [facility]  °[facility] [address]                                    [place], [place] (336) 342-8316 Therapy/tele-psych/case    °[facility] [address]  ° [place], [place] (336) 349-2233    °Dr. Arfeen  (336) 349-4544   °[facility] [place]  [employer] [place] [facility] 1) [address], [place] °2) [address], [place] °3)  [address], [place] (336) 349-3220 °(336) 342-7768 ° °(336) 342-8140   °[place] [facility] (336) 342-1394 or (336) 342-3537 (After Hours)    ° ° °

## 2014-12-09 NOTE — ED Notes (Signed)
Pt ambulating independently w/ steady gait on d/c in no acute distress, A&Ox4. D/c instructions reviewed w/ pt and family - pt and family deny any further questions or concerns at present. Rx given x4  

## 2014-12-09 NOTE — ED Notes (Signed)
Pt noticed hives and oral swelling when she woke up at 8 am.  Now stating difficulty breathing and is having difficulty swelling.

## 2014-12-09 NOTE — ED Provider Notes (Signed)
CSN: [id number]     Arrival date & time 12/09/14  1726 History   First MD Initiated Contact with Patient 12/09/14 1736     Chief Complaint  Patient presents with  . Allergic Reaction     (Consider location/radiation/quality/duration/timing/severity/associated sxs/prior Treatment) Patient is a 39 y.o. female presenting with allergic reaction.  Allergic Reaction Presenting symptoms: difficulty breathing, difficulty swallowing, rash (L leg) and swelling   Presenting symptoms: no wheezing   Severity:  Moderate Prior allergic episodes:  No prior episodes Context: no animal exposure, no chemicals, no cosmetics, no eggs, no food allergies, no insect bite/sting and no new detergents/soaps   Context comment:  Pt does relate she got a new mattress and slept on it for the first time last night Relieved by:  None tried Worsened by:  Nothing tried Ineffective treatments:  None tried   Past Medical History  Diagnosis Date  . Post traumatic stress disorder (PTSD)   . Anxiety   . Fibromyalgia    Past Surgical History  Procedure Laterality Date  . Cesarean section    . Tooth extraction     Family History  Problem Relation Age of Onset  . Lupus Mother   . Breast cancer Paternal Aunt   . Breast cancer Paternal Grandmother   . Cancer Father    Social History  Substance Use Topics  . Smoking status: Current Every Day Smoker  . Smokeless tobacco: Never Used  . Alcohol Use: Yes   OB History    Gravida Para Term Preterm AB TAB SAB Ectopic Multiple Living   Review of Systems  Constitutional: Negative for fever and chills.  HENT: Positive for congestion, facial swelling, trouble swallowing and voice change. Negative for postnasal drip.   Respiratory: Positive for shortness of breath. Negative for wheezing.   Cardiovascular: Negative for chest pain and leg swelling.  Gastrointestinal: Negative for nausea, vomiting, abdominal pain and diarrhea.  Genitourinary: Negative  for dysuria and hematuria.  Musculoskeletal: Negative for arthralgias and gait problem.  Skin: Positive for rash (L leg).  Neurological: Negative for light-headedness and headaches.  Psychiatric/Behavioral: Negative for confusion and agitation.  All other systems reviewed and are negative.     Allergies  Review of patient's allergies indicates no known allergies.  Home Medications   Prior to Admission medications   Medication Sig Start Date End Date Taking? Authorizing Provider  medroxyPROGESTERone (DEPO-PROVERA) 150 MG/ML injection Inject 150 mg into the muscle every 3 (three) months.   Yes Historical Provider, MD  ALPRAZolam Prudy Feeler) 0.5 MG tablet Take 1 tablet (0.5 mg total) by mouth 3 (three) times daily as needed for sleep or anxiety. Patient not taking: Reported on 12/09/2014 09/29/14 09/29/15  Hildred Laser, MD  diphenhydrAMINE (BENADRYL) 25 MG tablet Take 1 tablet (25 mg total) by mouth every 8 (eight) hours as needed. 12/09/14   Madolyn Frieze, MD  EPINEPHrine 0.3 mg/0.3 mL IJ SOAJ injection Inject 0.3 mLs (0.3 mg total) into the muscle once. 12/09/14   Madolyn Frieze, MD  famotidine (PEPCID) 20 MG tablet Take 1 tablet (20 mg total) by mouth 2 (two) times daily. 12/09/14   Madolyn Frieze, MD  methylPREDNISolone (MEDROL DOSEPAK) 4 MG TBPK tablet Follow package directions for dosing instructions 12/09/14   Madolyn Frieze, MD   BP 94/57 mmHg  Pulse 76  Temp(Src) 99.1 F (37.3 C) (Oral)  Resp 18  Ht  (1.626 m)  Wt 112  lb (50.803 kg)  BMI 19.22 kg/m2  SpO2 98% Physical Exam  Constitutional: She is oriented to person, place, and time. She appears distressed.  HENT:  Head: Normocephalic and atraumatic.  Mouth/Throat: There is trismus in the jaw. No uvula swelling. No oropharyngeal exudate, posterior oropharyngeal edema or posterior oropharyngeal erythema.  Eyes: Conjunctivae and EOM are normal.  Neck: Normal range of motion. Neck supple.  Cardiovascular: Normal rate, regular rhythm and  normal heart sounds.   No murmur heard. Pulmonary/Chest: Breath sounds normal. Stridor (very mild) present. She is in respiratory distress. She has no wheezes. She has no rales.  Abdominal: Soft. Bowel sounds are normal. There is no tenderness. There is no rebound and no guarding.  Musculoskeletal: Normal range of motion. She exhibits no tenderness.  Neurological: She is alert and oriented to person, place, and time.  Skin: Skin is warm and dry. Rash (L lateral thigh urticarial rash on exam) noted. She is not diaphoretic.  Psychiatric: Her behavior is normal.  Nursing note and vitals reviewed.   ED Course  Procedures (including critical care time) Labs Review Labs Reviewed - No data to display  Imaging Review No results found. I have personally reviewed and evaluated these images and lab results as part of my medical decision-making.   EKG Interpretation None      MDM   Final diagnoses:  Allergic reaction, initial encounter    39 year old female presents today with allergic reaction that started around 9:30 this morning. On exam patient is awake and alert and in mild distress secondary to sensation of throat swelling. Patient was subsequently given EpiPen injection in the thigh. Benadryl, Decadron, Pepcid. Patient improved significantly following injection with epi. Patient's O2 sats remained stable throughout this entire episode. Patient required no further intervention with EpiPen. Patient was given a milligram of Ativan due to post EpiPen anxiety. No clear factor that would cause this today. Patient denies any prior history of known allergies. Patient does relate she recently got a new mattress and is unsure but she did sleep on this mattress last night.  Following 4-5 hour observation patient remained stable. Patient was discharged home with EpiPen, Benadryl, Medrol Dosepak, Pepcid. Advised patient to be careful with what she is exposed to over the next few days in case it was  the mattress that caused this ordeal. Patient is agreeable. She was discharged home in good condition.    Madolyn Frieze, MD 12/10/14 1610  Gerhard Munch, MD 12/13/14 [id number]

## 2014-12-11 ENCOUNTER — Emergency Department (HOSPITAL_COMMUNITY)
Admission: EM | Admit: 2014-12-11 | Discharge: 2014-12-11 | Disposition: A | Discharge status: Home / Self Care | Payer: BLUE CROSS/BLUE SHIELD | Attending: Emergency Medicine | Admitting: Emergency Medicine

## 2014-12-11 ENCOUNTER — Encounter (HOSPITAL_COMMUNITY): Payer: Self-pay | Admitting: *Deleted

## 2014-12-11 DIAGNOSIS — Z720 Tobacco use: Secondary | ICD-10-CM | POA: Insufficient documentation

## 2014-12-11 DIAGNOSIS — T782XXA Anaphylactic shock, unspecified, initial encounter: Secondary | ICD-10-CM | POA: Insufficient documentation

## 2014-12-11 DIAGNOSIS — Z79899 Other long term (current) drug therapy: Secondary | ICD-10-CM | POA: Diagnosis not present

## 2014-12-11 DIAGNOSIS — Z8739 Personal history of other diseases of the musculoskeletal system and connective tissue: Secondary | ICD-10-CM | POA: Diagnosis not present

## 2014-12-11 DIAGNOSIS — Z793 Long term (current) use of hormonal contraceptives: Secondary | ICD-10-CM | POA: Diagnosis not present

## 2014-12-11 DIAGNOSIS — Z8659 Personal history of other mental and behavioral disorders: Secondary | ICD-10-CM | POA: Insufficient documentation

## 2014-12-11 DIAGNOSIS — R21 Rash and other nonspecific skin eruption: Secondary | ICD-10-CM | POA: Diagnosis present

## 2014-12-11 DIAGNOSIS — Z7952 Long term (current) use of systemic steroids: Secondary | ICD-10-CM | POA: Diagnosis not present

## 2014-12-11 DIAGNOSIS — R0602 Shortness of breath: Secondary | ICD-10-CM | POA: Insufficient documentation

## 2014-12-11 MED ORDER — SODIUM CHLORIDE 0.9 % IV BOLUS (SEPSIS)
1000.0000 mL | Freq: Once | INTRAVENOUS | Status: AC
  Administered 2014-12-11: 1000 mL via INTRAVENOUS

## 2014-12-11 MED ORDER — EPINEPHRINE 0.3 MG/0.3ML IJ SOAJ
INTRAMUSCULAR | Status: AC
  Administered 2014-12-11: 1 mg
  Filled 2014-12-11: qty 0.3

## 2014-12-11 MED ORDER — METHYLPREDNISOLONE SODIUM SUCC 125 MG IJ SOLR
125.0000 mg | Freq: Once | INTRAMUSCULAR | Status: AC
  Administered 2014-12-11: 125 mg via INTRAVENOUS
  Filled 2014-12-11: qty 2

## 2014-12-11 MED ORDER — DIPHENHYDRAMINE HCL 50 MG/ML IJ SOLN
25.0000 mg | Freq: Once | INTRAMUSCULAR | Status: AC
  Administered 2014-12-11: 25 mg via INTRAVENOUS
  Filled 2014-12-11: qty 1

## 2014-12-11 NOTE — ED Notes (Signed)
Pt states she was home and started feeling her lips swelling and tongue swelling and came straight to the ED due to a recent allergic reaction.  Pt states she came to the ED for help with the EPI Pen.

## 2014-12-11 NOTE — ED Provider Notes (Signed)
CSN: [id number]     Arrival date & time 12/11/14  1108 History   First MD Initiated Contact with Patient 12/11/14 1119     Chief Complaint  Patient presents with  . Allergic Reaction     (Consider location/radiation/quality/duration/timing/severity/associated sxs/prior Treatment) HPI  Pt presenting with c/o lip and tongue swelling, also itchy rash.  She states symptoms began approx 30 minutes prior to arrival.  She had no LOC, no vomiting. She reports she was seen for a similar allergic reaction 2 days ago in the ED, was given epipen but she was unsure how to use it, so did not give it to herself.  She also states she called the allergist for followup and was told their tests would not work if she was taking prednisone so she has not been taking the prednisone that was prescribed at the prior ED visit.  She is unsure what her reaction is to- she has not had any new foods or medications.  She states her symptoms began when she got a new mattress.  There are no other associated systemic symptoms, there are no other alleviating or modifying factors.   Past Medical History  Diagnosis Date  . Post traumatic stress disorder (PTSD)   . Anxiety   . Fibromyalgia    Past Surgical History  Procedure Laterality Date  . Cesarean section    . Tooth extraction     Family History  Problem Relation Age of Onset  . Lupus Mother   . Breast cancer Paternal Aunt   . Breast cancer Paternal Grandmother   . Cancer Father    Social History  Substance Use Topics  . Smoking status: Current Every Day Smoker  . Smokeless tobacco: Never Used  . Alcohol Use: Yes   OB History    Gravida Para Term Preterm AB TAB SAB Ectopic Multiple Living   Review of Systems  ROS reviewed and all otherwise negative except for mentioned in HPI    Allergies  Review of patient's allergies indicates no known allergies.  Home Medications   Prior to Admission medications   Medication Sig Start Date  End Date Taking? Authorizing Provider  ALPRAZolam Prudy Feeler) 0.5 MG tablet Take 1 tablet (0.5 mg total) by mouth 3 (three) times daily as needed for sleep or anxiety. Patient not taking: Reported on 12/09/2014 09/29/14 09/29/15  Hildred Laser, MD  diphenhydrAMINE (BENADRYL) 25 MG tablet Take 1 tablet (25 mg total) by mouth every 8 (eight) hours as needed. 12/09/14   Madolyn Frieze, MD  EPINEPHrine 0.3 mg/0.3 mL IJ SOAJ injection Inject 0.3 mLs (0.3 mg total) into the muscle once. 12/09/14   Madolyn Frieze, MD  famotidine (PEPCID) 20 MG tablet Take 1 tablet (20 mg total) by mouth 2 (two) times daily. 12/09/14   Madolyn Frieze, MD  medroxyPROGESTERone (DEPO-PROVERA) 150 MG/ML injection Inject 150 mg into the muscle every 3 (three) months.    Historical Provider, MD  methylPREDNISolone (MEDROL DOSEPAK) 4 MG TBPK tablet Follow package directions for dosing instructions 12/09/14   Madolyn Frieze, MD   BP 112/72 mmHg  Pulse 73  Temp(Src) 98.6 F (37 C) (Oral)  Resp 24  Ht  (1.626 m)  Wt 112 lb (50.803 kg)  BMI 19.22 kg/m2  SpO2 100%  Vitals [id number] Exam  Physical Examination: General appearance - alert, well appearing, and in no distress Mental status - alert, oriented to person, place, and  time Eyes - no conjunctival injection, no scleral icterus Mouth - mucous membranes moist, pharynx normal without lesions, upper lip swelling, no tongue swelling, no swelling under tongue, OP clear, uvula midline, palate symmetric Chest - clear to auscultation, no wheezes, rales or rhonchi, symmetric air entry Heart - normal rate, regular rhythm, normal S1, S2, no murmurs, rubs, clicks or gallops Abdomen - soft, nontender, nondistended, no masses or organomegaly Neurological - alert, oriented, normal speech Extremities - peripheral pulses normal, no pedal edema, no clubbing or cyanosis Skin - normal coloration and turgor, no rashes  ED Course  Procedures (including critical care time)  CRITICAL  CARE Performed by: Ethelda Chick Total critical care time: 35 Critical care time was exclusive of separately billable procedures and treating other patients. Critical care was necessary to treat or prevent imminent or life-threatening deterioration. Critical care was time spent personally by me on the following activities: development of treatment plan with patient and/or surrogate as well as nursing, discussions with consultants, evaluation of patient's response to treatment, examination of patient, obtaining history from patient or surrogate, ordering and performing treatments and interventions, ordering and review of laboratory studies, ordering and review of radiographic studies, pulse oximetry and re-evaluation of patient's condition. Labs Review Labs Reviewed - No data to display  Imaging Review No results found. I have personally reviewed and evaluated these images and lab results as part of my medical decision-making.   EKG Interpretation None      MDM   Final diagnoses:  Anaphylaxis, initial encounter    Pt presenting with recurrence of allergic reaction- she feels lip and tongue swelling, she has a change in her voice and feels short of breath.  Her symptoms were improved quickly with epinephrine.  She was also given solumedrol and benadryl.  She was observed almost 3 hours- she was encouraged to take the prednisone that was prescribed at prior visit.  She was instructed to keep epipen with her at all times and f/u with allergist.  Discharged with strict return precautions.  Pt agreeable with plan.  12:51 PM pt feeling no further short of breath, continues to have upper lip swelling, but it is somewhat improved.    Jerelyn Scott, MD 12/11/14 [phone]

## 2014-12-11 NOTE — Discharge Instructions (Signed)
Return to the ED with any concerns including difficulty breathing, lip or tongue swelling, fainting, vomiting, decreased level of alertness/lethargy, or any other alarming symptoms °

## 2015-01-11 ENCOUNTER — Encounter: Payer: Self-pay | Admitting: Obstetrics and Gynecology

## 2015-01-11 ENCOUNTER — Ambulatory Visit: Payer: BLUE CROSS/BLUE SHIELD

## 2015-01-11 ENCOUNTER — Ambulatory Visit (INDEPENDENT_AMBULATORY_CARE_PROVIDER_SITE_OTHER): Payer: BLUE CROSS/BLUE SHIELD | Admitting: Obstetrics and Gynecology

## 2015-01-11 VITALS — BP 93/59 | HR 73 | Ht 64.0 in | Wt 107.2 lb

## 2015-01-11 DIAGNOSIS — Z720 Tobacco use: Secondary | ICD-10-CM | POA: Diagnosis not present

## 2015-01-11 DIAGNOSIS — F419 Anxiety disorder, unspecified: Secondary | ICD-10-CM

## 2015-01-11 DIAGNOSIS — Z3042 Encounter for surveillance of injectable contraceptive: Secondary | ICD-10-CM

## 2015-01-11 DIAGNOSIS — F4000 Agoraphobia, unspecified: Secondary | ICD-10-CM

## 2015-01-11 DIAGNOSIS — F32A Depression, unspecified: Secondary | ICD-10-CM

## 2015-01-11 DIAGNOSIS — F329 Major depressive disorder, single episode, unspecified: Secondary | ICD-10-CM

## 2015-01-11 LAB — POCT URINE PREGNANCY: Preg Test, Ur: NEGATIVE

## 2015-01-11 MED ORDER — MEDROXYPROGESTERONE ACETATE 150 MG/ML IM SUSP
150.0000 mg | Freq: Once | INTRAMUSCULAR | Status: AC
  Administered 2015-01-11: 150 mg via INTRAMUSCULAR

## 2015-01-11 NOTE — Progress Notes (Signed)
GYNECOLOGY PROGRESS NOTE  Subjective:    Patient ID: Meredith Wood, female    DOB: [birth year], 39 y.o.   MRN: [id number]  HPI  Patient is a 39 y.o. G21P1001 female who presents for f/u of anxiety/depression and for Depo Provera injection for contraception.  Notes that she did not initially start to take her medications until ~ 1 month ago.  Notes that the Xanax helps her to sleep at night, but still has anxiety.  Initiated Wellbutrin for depression and smoking symptoms.  Has increased smoking to 4-5 cig/day, and reports no change in mood. Still has difficulties leaving her ouse (agorophobia), Is currently mild but patient feels as though it is progressing. Denies SI/HI.   The following portions of the patient's history were reviewed and updated as appropriate: allergies, current medications, past family history, past medical history, past social history, past surgical history and problem list.  Review of Systems A comprehensive review of systems was negative except for: Gastrointestinal: positive for decreased appetite Behavioral/Psych: positive for anxiety and depression Allergic/Immunologic: positive for angioedema and urticaria  to unknown allergen.  Patient was seen in the ER over past several weeks and was prescribed antihistamine., also given an Epi Pen.  Objective:   Blood pressure 93/59, pulse 73, height  (1.626 m), weight 107 lb 4 oz (48.648 kg). General appearance: alert and no distress Exam deferred today.  Labs:: UPT negative  Assessment:  Anxiety/depression Agorophobia Tobacco abuse Contraception management  Plan:   Continue current meds, but can increase Xanax to 1 mg from 0.5 mg.  If Wellbutrin not helping, will consider changing to a different antidepressant. Will begin using nicotine patch Depo injection given today. RTC in 1 month for f/u.   A total of 15 minutes were spent face-to-face with the patient during this encounter and over half of that time  dealt with counseling and coordination of care.

## 2015-01-11 NOTE — Progress Notes (Signed)
Patient ID: Meredith Wood, female   DOB: [birth year], 39 y.o.   MRN: [id number] Pt seen in ER for severe allergy (unsure of to what), given epi-pen in ER 11/2014 with face, lips swelling.  If she stops taking antihistamine she has a reaction and needs to stop this to see specialist.  Pt missed last depo-provera. Will do UPT and give if negative.

## 2015-01-12 DIAGNOSIS — F4000 Agoraphobia, unspecified: Secondary | ICD-10-CM | POA: Insufficient documentation

## 2015-03-14 ENCOUNTER — Encounter: Payer: Self-pay | Admitting: Obstetrics and Gynecology

## 2015-03-14 ENCOUNTER — Telehealth: Payer: Self-pay | Admitting: Obstetrics and Gynecology
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
## 2015-03-21 ENCOUNTER — Telehealth: Payer: Self-pay | Admitting: Obstetrics and Gynecology

## 2015-03-21 NOTE — Telephone Encounter (Signed)
Spoke with patient and refaxed letter to Junius [email or web address] Bright.[email or web address]

## 2015-03-21 NOTE — Telephone Encounter (Signed)
Pt was talking with you earlier and she has the fax number for were you will need to send the letter...fax number is [phone] attn: Junius FinnerPamela Bright.

## 2017-08-23 ENCOUNTER — Encounter: Payer: Self-pay | Admitting: Obstetrics and Gynecology

## 2017-08-23 ENCOUNTER — Ambulatory Visit (INDEPENDENT_AMBULATORY_CARE_PROVIDER_SITE_OTHER): Payer: Medicare Other | Admitting: Obstetrics and Gynecology

## 2017-08-23 VITALS — BP 94/56 | HR 94 | Ht 64.0 in | Wt 109.7 lb

## 2017-08-23 DIAGNOSIS — Z720 Tobacco use: Secondary | ICD-10-CM | POA: Diagnosis not present

## 2017-08-23 DIAGNOSIS — Z3009 Encounter for other general counseling and advice on contraception: Secondary | ICD-10-CM

## 2017-08-23 DIAGNOSIS — R102 Pelvic and perineal pain: Secondary | ICD-10-CM | POA: Diagnosis not present

## 2017-08-23 DIAGNOSIS — F419 Anxiety disorder, unspecified: Secondary | ICD-10-CM

## 2017-08-23 DIAGNOSIS — Z9882 Breast implant status: Secondary | ICD-10-CM | POA: Diagnosis not present

## 2017-08-23 DIAGNOSIS — Z113 Encounter for screening for infections with a predominantly sexual mode of transmission: Secondary | ICD-10-CM | POA: Diagnosis not present

## 2017-08-23 DIAGNOSIS — Z1231 Encounter for screening mammogram for malignant neoplasm of breast: Secondary | ICD-10-CM

## 2017-08-23 DIAGNOSIS — F329 Major depressive disorder, single episode, unspecified: Secondary | ICD-10-CM

## 2017-08-23 DIAGNOSIS — Z124 Encounter for screening for malignant neoplasm of cervix: Secondary | ICD-10-CM

## 2017-08-23 DIAGNOSIS — T7431XA Adult psychological abuse, confirmed, initial encounter: Secondary | ICD-10-CM | POA: Diagnosis not present

## 2017-08-23 DIAGNOSIS — Z01411 Encounter for gynecological examination (general) (routine) with abnormal findings: Secondary | ICD-10-CM

## 2017-08-23 MED ORDER — VENLAFAXINE HCL 75 MG PO TABS: 75.0000 mg | ORAL_TABLET | Freq: Two times a day (BID) | ORAL | 3 refills | Status: DC

## 2017-08-23 MED ORDER — MEDROXYPROGESTERONE ACETATE 150 MG/ML IM SUSP: 150.0000 mg | INTRAMUSCULAR | 3 refills | Status: DC

## 2017-08-23 MED ORDER — ALPRAZOLAM 0.5 MG PO TABS: 0.5000 mg | ORAL_TABLET | Freq: Three times a day (TID) | ORAL | 3 refills | Status: DC | PRN

## 2017-08-23 MED ORDER — VARENICLINE TARTRATE 1 MG PO TABS: 1.0000 mg | ORAL_TABLET | Freq: Two times a day (BID) | ORAL | 3 refills | Status: DC

## 2017-08-23 MED ORDER — VARENICLINE TARTRATE 0.5 MG X 11 & 1 MG X 42 PO MISC: ORAL | 0 refills | Status: DC

## 2017-08-23 NOTE — Patient Instructions (Signed)
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
## 2017-08-23 NOTE — Progress Notes (Signed)
Pt is doing well no concerns.  

## 2017-08-24 LAB — COMPREHENSIVE METABOLIC PANEL
ALBUMIN: 4.9 g/dL (ref 3.5–5.5)
ALT: 7 IU/L (ref 0–32)
AST: 11 IU/L (ref 0–40)
Albumin/Globulin Ratio: 2.1 (ref 1.2–2.2)
Alkaline Phosphatase: 50 IU/L (ref 39–117)
BILIRUBIN TOTAL: 0.4 mg/dL (ref 0.0–1.2)
BUN / CREAT RATIO: 11 (ref 9–23)
BUN: 10 mg/dL (ref 6–20)
CO2: 25 mmol/L (ref 20–29)
Calcium: 9.8 mg/dL (ref 8.7–10.2)
Chloride: 105 mmol/L (ref 96–106)
Creatinine, Ser: 0.91 mg/dL (ref 0.57–1.00)
GFR calc non Af Amer: 80 mL/min/{1.73_m2} (ref >59)
GFR, EST AFRICAN AMERICAN: 92 mL/min/{1.73_m2} (ref >59)
GLOBULIN, TOTAL: 2.3 g/dL (ref 1.5–4.5)
GLUCOSE: 63 mg/dL — AB (ref 65–99)
Potassium: 4.4 mmol/L (ref 3.5–5.2)
Sodium: 142 mmol/L (ref 134–144)
TOTAL PROTEIN: 7.2 g/dL (ref 6.0–8.5)

## 2017-08-24 LAB — CBC
HEMATOCRIT: 35.8 % (ref 34.0–46.6)
HEMOGLOBIN: 11.7 g/dL (ref 11.1–15.9)
MCH: 27.7 pg (ref 26.6–33.0)
MCHC: 32.7 g/dL (ref 31.5–35.7)
MCV: 85 fL (ref 79–97)
Platelets: 275 10*3/uL (ref 150–379)
RBC: 4.22 x10E6/uL (ref 3.77–5.28)
RDW: 15.2 % (ref 12.3–15.4)
WBC: 4.5 10*3/uL (ref 3.4–10.8)

## 2017-08-24 LAB — RPR: RPR Ser Ql: NONREACTIVE

## 2017-08-27 ENCOUNTER — Ambulatory Visit: Payer: Medicare Other

## 2017-08-28 LAB — PAP IG, CT-NG TV RFX HPV ALL
Chlamydia, Nuc. Acid Amp: NEGATIVE
Gonococcus, Nuc. Acid Amp: NEGATIVE
PAP SMEAR COMMENT: 0
Trich vag by NAA: NEGATIVE

## 2017-09-12 ENCOUNTER — Telehealth: Payer: Self-pay | Admitting: Obstetrics and Gynecology

## 2017-09-12 NOTE — Telephone Encounter (Signed)
The patient called and stated that she would like to speak to her provider or a nurse in regards to some questions she has about her medication that was prescribed to her. Please advise.

## 2017-09-12 NOTE — Telephone Encounter (Signed)
Pt called back to see what questions she had concerning her medication no answer LM via voicemail to call the office.

## 2017-09-18 NOTE — Telephone Encounter (Signed)
Pt was called no answer LM via voicemail to call the office.  

## 2018-04-28 ENCOUNTER — Telehealth: Payer: Self-pay

## 2018-04-28 NOTE — Telephone Encounter (Signed)
Pt called no answer LM via voicemail that since she was 43 years-old she needed to start getting mammogram. Pt was informed that an ordered had been placed for her at [facility] number given and also informed pt that she needed to call the office to make an appointment to see Dr. Valentino Saxon for her annual exam by 08/24/2018.

## 2018-07-16 NOTE — Progress Notes (Signed)
Pt present today for her first depo injection. UPT done negative.

## 2018-07-17 ENCOUNTER — Other Ambulatory Visit: Payer: Self-pay

## 2018-07-17 ENCOUNTER — Encounter: Payer: Self-pay | Admitting: Obstetrics and Gynecology

## 2018-07-17 ENCOUNTER — Ambulatory Visit (INDEPENDENT_AMBULATORY_CARE_PROVIDER_SITE_OTHER): Payer: Medicare Other | Admitting: Obstetrics and Gynecology

## 2018-07-17 VITALS — BP 118/82 | HR 80 | Ht 64.0 in | Wt 110.5 lb

## 2018-07-17 DIAGNOSIS — F419 Anxiety disorder, unspecified: Secondary | ICD-10-CM

## 2018-07-17 DIAGNOSIS — Z3042 Encounter for surveillance of injectable contraceptive: Secondary | ICD-10-CM | POA: Diagnosis not present

## 2018-07-17 DIAGNOSIS — F329 Major depressive disorder, single episode, unspecified: Secondary | ICD-10-CM | POA: Diagnosis not present

## 2018-07-17 MED ORDER — ALPRAZOLAM 0.5 MG PO TABS: 0.5000 mg | ORAL_TABLET | Freq: Three times a day (TID) | ORAL | 3 refills | Status: DC | PRN

## 2018-07-17 MED ORDER — MEDROXYPROGESTERONE ACETATE 150 MG/ML IM SUSP
150.0000 mg | Freq: Once | INTRAMUSCULAR | Status: AC
  Administered 2018-07-17: 150 mg via INTRAMUSCULAR

## 2018-07-17 NOTE — Progress Notes (Signed)
   GYNECOLOGY CLINIC PROGRESS NOTES  Subjective:    Meredith Wood is a 43 y.o. female who presents for contraception management. The patient has no complaints today. She desires to resume Depo Provera for contraception.  She has been on this in the past, last injection was some time ~ 8 months ago. The patient is sexually active. Pertinent past medical history: current smoker.  LMP was 06/29/18.  Last intercourse was ~ 1 week ago, protected (used condoms).   Of note, patient also desires to discuss her anxiety.  She has a history of generalized anxiety disorder and depression, and notes that with everything that is going on right now (specifically in the wake of the Coronavirus pandemic), she states that her anxiety has "gone through the rough".  She notes that she is cleaning surfaces in her house multiple times per day, and never feels settled.  She also reports that her appetite is essentially nonexistent, and that even her 5 y.o. daughter has begun noticing that she is not eating.  She admits that she was prescribed Effexor last week, however has not yet started the medication.  She also is getting low on her Xanax (was prescribed last year by me, prior to being seen by a therapist), but notes that she also has not been taking it as prescribed (only takes it once or twice per day, but not even everyday).     Menstrual History: OB History    Gravida  1   Para  1   Term  1   Preterm      AB      Living  1     SAB      TAB      Ectopic      Multiple      Live Births  1             The following portions of the patient's history were reviewed and updated as appropriate: allergies, current medications, past family history, past medical history, past social history, past surgical history and problem list.  Review of Systems Pertinent items noted in HPI and remainder of comprehensive ROS otherwise negative.   Objective:    BP 118/82   Pulse 80   Ht 5\' 4"  (1.626 m)    Wt 110 lb 8 oz (50.1 kg)   BMI 18.97 kg/m  General appearance: alert and no distress, well-groomed Psychiatric: appears anxious, has normal speech, no delusions or intrusive thoughts.   Assessment:    43 y.o., resuming Depo-Provera injections, no contraindications.   History of anxiety and depression  Plan:   -Contraception: Depo-Provera injections.  UPT negative. Injection given today. RTC in 3 months for next injection.  -H/o anxiety and depression. Discussed with patient that her symptoms of increasing anxiety and h/o depression it is imperative that she begin the Effexor.  Also discussed that she may not see a change in symptoms for several weeks as it takes time for the Effexor to build in the system.  During that time, she should continue use of her Xanax, but take as prescribed to help manage her symptoms of increasing anxiety. Will refill Xanax.   To f/u if symptoms persist or worsen, or f/u with her therapist.    A total of 15 minutes were spent face-to-face with the patient during this encounter and over half of that time dealt with counseling and coordination of care.   Hildred Laser, MD [facility]

## 2018-10-01 ENCOUNTER — Telehealth: Payer: Self-pay | Admitting: Obstetrics and Gynecology

## 2018-10-01 MED ORDER — MEDROXYPROGESTERONE ACETATE 150 MG/ML IM SUSP: 150.0000 mg | INTRAMUSCULAR | 0 refills | Status: DC

## 2018-10-01 NOTE — Telephone Encounter (Signed)
Pt called no answer LM via voicemail that her medication was refilled and sent to [facility] in [place].

## 2018-10-01 NOTE — Telephone Encounter (Signed)
The patient called and stated that she has an appointment for her depo injection tomorrow but needs a refill. Please advise.

## 2018-10-02 ENCOUNTER — Other Ambulatory Visit: Payer: Self-pay

## 2018-10-02 ENCOUNTER — Ambulatory Visit (INDEPENDENT_AMBULATORY_CARE_PROVIDER_SITE_OTHER): Payer: Medicare Other | Admitting: Obstetrics and Gynecology

## 2018-10-02 VITALS — BP 103/69 | HR 79 | Ht 64.0 in | Wt 104.2 lb

## 2018-10-02 DIAGNOSIS — Z3042 Encounter for surveillance of injectable contraceptive: Secondary | ICD-10-CM | POA: Diagnosis not present

## 2018-10-02 MED ORDER — MEDROXYPROGESTERONE ACETATE 150 MG/ML IM SUSP
150.0000 mg | Freq: Once | INTRAMUSCULAR | Status: AC
  Administered 2018-10-02: 150 mg via INTRAMUSCULAR

## 2018-10-02 NOTE — Progress Notes (Signed)
Date last pap: 08/26/17. Last Depo-Provera: 07/17/18. Side Effects if any: . Serum HCG indicated? N/a. Depo-Provera 150 mg IM given by: FH, LPN. Next appointment due December 18, 2018-January 01, 2019.  BP 103/69   Pulse 79   Ht 5\' 4"  (1.626 m)   Wt 104 lb 3.2 oz (47.3 kg)   BMI 17.89 kg/m

## 2018-10-20 ENCOUNTER — Telehealth: Payer: Self-pay

## 2018-10-20 NOTE — Patient Instructions (Signed)
Preventive Care 41-41 Years Old, Female Preventive care refers to visits with your health care provider and lifestyle choices that can promote health and wellness. This includes:  A yearly physical exam. This may also be called an annual well check.  Regular dental visits and eye exams.  Immunizations.  Screening for certain conditions.  Healthy lifestyle choices, such as eating a healthy diet, getting regular exercise, not using drugs or products that contain nicotine and tobacco, and limiting alcohol use. What can I expect for my preventive care visit? Physical exam Your health care provider will check your:  Height and weight. This may be used to calculate body mass index (BMI), which tells if you are at a healthy weight.  Heart rate and blood pressure.  Skin for abnormal spots. Counseling Your health care provider may ask you questions about your:  Alcohol, tobacco, and drug use.  Emotional well-being.  Home and relationship well-being.  Sexual activity.  Eating habits.  Work and work environment.  Method of birth control.  Menstrual cycle.  Pregnancy history. What immunizations do I need?  Influenza (flu) vaccine  This is recommended every year. Tetanus, diphtheria, and pertussis (Tdap) vaccine  You may need a Td booster every 10 years. Varicella (chickenpox) vaccine  You may need this if you have not been vaccinated. Zoster (shingles) vaccine  You may need this after age 60. Measles, mumps, and rubella (MMR) vaccine  You may need at least one dose of MMR if you were born in 1957 or later. You may also need a second dose. Pneumococcal conjugate (PCV13) vaccine  You may need this if you have certain conditions and were not previously vaccinated. Pneumococcal polysaccharide (PPSV23) vaccine  You may need one or two doses if you smoke cigarettes or if you have certain conditions. Meningococcal conjugate (MenACWY) vaccine  You may need this if you  have certain conditions. Hepatitis A vaccine  You may need this if you have certain conditions or if you travel or work in places where you may be exposed to hepatitis A. Hepatitis B vaccine  You may need this if you have certain conditions or if you travel or work in places where you may be exposed to hepatitis B. Haemophilus influenzae type b (Hib) vaccine  You may need this if you have certain conditions. Human papillomavirus (HPV) vaccine  If recommended by your health care provider, you may need three doses over 6 months. You may receive vaccines as individual doses or as more than one vaccine together in one shot (combination vaccines). Talk with your health care provider about the risks and benefits of combination vaccines. What tests do I need? Blood tests  Lipid and cholesterol levels. These may be checked every 5 years, or more frequently if you are over 50 years old.  Hepatitis C test.  Hepatitis B test. Screening  Lung cancer screening. You may have this screening every year starting at age 41 if you have a 30-pack-year history of smoking and currently smoke or have quit within the past 15 years.  Colorectal cancer screening. All adults should have this screening starting at age 41 and continuing until age 75. Your health care provider may recommend screening at age 41 if you are at increased risk. You will have tests every 1-10 years, depending on your results and the type of screening test.  Diabetes screening. This is done by checking your blood sugar (glucose) after you have not eaten for a while (fasting). You may have this   done every 1-3 years.  Mammogram. This may be done every 1-2 years. Talk with your health care provider about when you should start having regular mammograms. This may depend on whether you have a family history of breast cancer.  BRCA-related cancer screening. This may be done if you have a family history of breast, ovarian, tubal, or peritoneal  cancers.  Pelvic exam and Pap test. This may be done every 3 years starting at age 23. Starting at age 41, this may be done every 5 years if you have a Pap test in combination with an HPV test. Other tests  Sexually transmitted disease (STD) testing.  Bone density scan. This is done to screen for osteoporosis. You may have this scan if you are at high risk for osteoporosis. Follow these instructions at home: Eating and drinking  Eat a diet that includes fresh fruits and vegetables, whole grains, lean protein, and low-fat dairy.  Take vitamin and mineral supplements as recommended by your health care provider.  Do not drink alcohol if: ? Your health care provider tells you not to drink. ? You are 41, may be pregnant, or are planning to become pregnant.  If you drink alcohol: ? Limit how much you have to 0-1 drink a day. ? Be aware of how much alcohol is in your drink. In the [place] one drink equals one 12 oz bottle of beer (355 mL), one 5 oz glass of wine (148 mL), or one 1 oz glass of hard liquor (44 mL). Lifestyle  Take daily care of your teeth and gums.  Stay active. Exercise for at least 30 minutes on 5 or more days each week.  Do not use any products that contain nicotine or tobacco, such as cigarettes, e-cigarettes, and chewing tobacco. If you need help quitting, ask your health care provider.  If you are sexually active, practice safe sex. Use a condom or other form of birth control (contraception) in order to prevent pregnancy and STIs (sexually transmitted infections).  If told by your health care provider, take low-dose aspirin daily starting at age 27. What's next?  Visit your health care provider once a year for a well check visit.  Ask your health care provider how often you should have your eyes and teeth checked.  Stay up to date on all vaccines. This information is not intended to replace advice given to you by your health care provider. Make sure you  discuss any questions you have with your health care provider. Document Released: 05/06/2015 Document Revised: 12/19/2017 Document Reviewed: 12/19/2017 Elsevier Patient Education  2020 [place] self-awareness is knowing how your breasts look and feel. Doing breast self-awareness is important. It allows you to catch a breast problem early while it is still small and can be treated. All women should do breast self-awareness, including women who have had breast implants. Tell your doctor if you notice a change in your breasts. What you need:  A mirror.  A well-lit room. How to do a breast self-exam A breast self-exam is one way to learn what is normal for your breasts and to check for changes. To do a breast self-exam: Look for changes  1. Take off all the clothes above your waist. 2. Stand in front of a mirror in a room with good lighting. 3. Put your hands on your hips. 4. Push your hands down. 5. Look at your breasts and nipples in the mirror to see if one breast or nipple looks  different from the other. Check to see if: ? The shape of one breast is different. ? The size of one breast is different. ? There are wrinkles, dips, and bumps in one breast and not the other. 6. Look at each breast for changes in the skin, such as: ? Redness. ? Scaly areas. 7. Look for changes in your nipples, such as: ? Liquid around the nipples. ? Bleeding. ? Dimpling. ? Redness. ? A change in where the nipples are. Feel for changes  1. Lie on your back on the floor. 2. Feel each breast. To do this, follow these steps: ? Pick a breast to feel. ? Put the arm closest to that breast above your head. ? Use your other arm to feel the nipple area of your breast. Feel the area with the pads of your three middle fingers by making small circles with your fingers. For the first circle, press lightly. For the second circle, press harder. For the third circle, press even harder.  ? Keep making circles with your fingers at the different pressures as you move down your breast. Stop when you feel your ribs. ? Move your fingers a little toward the center of your body. ? Start making circles with your fingers again, this time going up until you reach your collarbone. ? Keep making up-and-down circles until you reach your armpit. Remember to keep using the three pressures. ? Feel the other breast in the same way. 3. Sit or stand in the tub or shower. 4. With soapy water on your skin, feel each breast the same way you did in step 2 when you were lying on the floor. Write down what you find Writing down what you find can help you remember what to tell your doctor. Write down:  What is normal for each breast.  Any changes you find in each breast, including: ? The kind of changes you find. ? Whether you have pain. ? Size and location of any lumps.  When you last had your menstrual period. General tips  Check your breasts every month.  If you are breastfeeding, the best time to check your breasts is after you feed your baby or after you use a breast pump.  If you get menstrual periods, the best time to check your breasts is 5-7 days after your menstrual period is over.  With time, you will become comfortable with the self-exam, and you will begin to know if there are changes in your breasts. Contact a doctor if you:  See a change in the shape or size of your breasts or nipples.  See a change in the skin of your breast or nipples, such as red or scaly skin.  Have fluid coming from your nipples that is not normal.  Find a lump or thick area that was not there before.  Have pain in your breasts.  Have any concerns about your breast health. Summary  Breast self-awareness includes looking for changes in your breasts, as well as feeling for changes within your breasts.  Breast self-awareness should be done in front of a mirror in a well-lit room.  You should  check your breasts every month. If you get menstrual periods, the best time to check your breasts is 5-7 days after your menstrual period is over.  Let your doctor know of any changes you see in your breasts, including changes in size, changes on the skin, pain or tenderness, or fluid from your nipples that is not normal. This  information is not intended to replace advice given to you by your health care provider. Make sure you discuss any questions you have with your health care provider. Document Released: 09/26/2007 Document Revised: 11/26/2017 Document Reviewed: 11/26/2017 Elsevier Patient Education  2020 [employer].

## 2018-10-20 NOTE — Telephone Encounter (Signed)
No answer lmtrc for prescreening.

## 2018-10-20 NOTE — Progress Notes (Signed)
Annual exam. Pt's last pap was 08/26/17 with neg results. Pt is due to mammogram exam order placed.  Pt stated that she has concerns about getting an mammogram due to having breast implants. Pt stated that she was overall doing well. Denies any issues.

## 2018-10-21 ENCOUNTER — Other Ambulatory Visit: Payer: Self-pay

## 2018-10-21 ENCOUNTER — Encounter: Payer: Self-pay | Admitting: Obstetrics and Gynecology

## 2018-10-21 ENCOUNTER — Ambulatory Visit (INDEPENDENT_AMBULATORY_CARE_PROVIDER_SITE_OTHER): Payer: Medicare Other | Admitting: Obstetrics and Gynecology

## 2018-10-21 VITALS — BP 97/67 | HR 68 | Ht 64.0 in | Wt 108.4 lb

## 2018-10-21 DIAGNOSIS — Z124 Encounter for screening for malignant neoplasm of cervix: Secondary | ICD-10-CM

## 2018-10-21 DIAGNOSIS — R6889 Other general symptoms and signs: Secondary | ICD-10-CM

## 2018-10-21 DIAGNOSIS — Z01419 Encounter for gynecological examination (general) (routine) without abnormal findings: Secondary | ICD-10-CM

## 2018-10-21 DIAGNOSIS — Z1231 Encounter for screening mammogram for malignant neoplasm of breast: Secondary | ICD-10-CM

## 2018-10-21 DIAGNOSIS — Z13220 Encounter for screening for lipoid disorders: Secondary | ICD-10-CM

## 2018-10-21 DIAGNOSIS — F419 Anxiety disorder, unspecified: Secondary | ICD-10-CM

## 2018-10-21 DIAGNOSIS — Z9882 Breast implant status: Secondary | ICD-10-CM

## 2018-10-21 DIAGNOSIS — F329 Major depressive disorder, single episode, unspecified: Secondary | ICD-10-CM

## 2018-10-21 DIAGNOSIS — Z720 Tobacco use: Secondary | ICD-10-CM

## 2018-10-21 DIAGNOSIS — Z131 Encounter for screening for diabetes mellitus: Secondary | ICD-10-CM
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
## 2019-01-06 ENCOUNTER — Other Ambulatory Visit: Payer: Self-pay | Admitting: Obstetrics and Gynecology

## 2019-01-30 ENCOUNTER — Other Ambulatory Visit: Payer: Self-pay | Admitting: Obstetrics and Gynecology

## 2019-02-04 ENCOUNTER — Other Ambulatory Visit: Payer: Self-pay | Admitting: Obstetrics and Gynecology

## 2019-02-04 NOTE — Telephone Encounter (Signed)
The patient called and stated that she needs to speak with her nurse. Patient needs a refill of her medication ALPRAZolam (XANAX) 0.5 MG tablet [325]. The patient was unsuccessful trying to refill the medication through her pharmacy and via [place]. Please advise.

## 2019-02-04 NOTE — Telephone Encounter (Signed)
I sent in a refill of her medication last week on 10/9.

## 2019-02-19 ENCOUNTER — Telehealth: Payer: Self-pay

## 2019-02-19 NOTE — Telephone Encounter (Signed)
Pt req return call stating she is unable to refill alprazolam 0.5 mg. Please call pt.

## 2019-02-20 MED ORDER — ALPRAZOLAM 0.5 MG PO TABS: 0.5000 mg | ORAL_TABLET | Freq: Three times a day (TID) | ORAL | 3 refills | Status: DC | PRN

## 2019-02-20 NOTE — Addendum Note (Signed)
Addended by: Augusto Gamble on: 02/20/2019 07:17 PM   Modules accepted: Orders

## 2019-02-20 NOTE — Telephone Encounter (Signed)
Sent pt a [employer] as well. Please advise concerning refill of medication. Thanks [employer]

## 2019-02-20 NOTE — Telephone Encounter (Signed)
Pt called no answer lm via vm informing pt that her message was sent to Dr. Marcelline Mates concerning refill of xanax. Pt was advised to please check her mychart from message concerning medication refill.

## 2019-02-23 NOTE — Telephone Encounter (Signed)
Spoke with pt via mychart. Pt is aware.

## 2019-03-04 ENCOUNTER — Other Ambulatory Visit: Payer: Self-pay

## 2019-03-04 ENCOUNTER — Ambulatory Visit (INDEPENDENT_AMBULATORY_CARE_PROVIDER_SITE_OTHER): Payer: Medicare Other | Admitting: Obstetrics and Gynecology

## 2019-03-04 ENCOUNTER — Encounter: Payer: Self-pay | Admitting: Obstetrics and Gynecology

## 2019-03-04 VITALS — BP 109/67 | HR 97 | Ht 64.0 in | Wt 119.2 lb

## 2019-03-04 DIAGNOSIS — F419 Anxiety disorder, unspecified: Secondary | ICD-10-CM

## 2019-03-04 DIAGNOSIS — Z720 Tobacco use: Secondary | ICD-10-CM | POA: Diagnosis not present

## 2019-03-04 DIAGNOSIS — R399 Unspecified symptoms and signs involving the genitourinary system: Secondary | ICD-10-CM

## 2019-03-04 DIAGNOSIS — Z3009 Encounter for other general counseling and advice on contraception: Secondary | ICD-10-CM | POA: Diagnosis not present

## 2019-03-04 MED ORDER — CIPROFLOXACIN HCL 500 MG PO TABS: 500.0000 mg | ORAL_TABLET | Freq: Two times a day (BID) | ORAL | 0 refills | Status: DC

## 2019-03-04 MED ORDER — BUPROPION HCL ER (XL) 150 MG PO TB24
150.0000 mg | ORAL_TABLET | Freq: Every day | ORAL | 11 refills | Status: DC
Start: 2019-03-04 — End: 2019-05-26

## 2019-03-04 MED ORDER — MEDROXYPROGESTERONE ACETATE 150 MG/ML IM SUSP
150.0000 mg | Freq: Once | INTRAMUSCULAR | Status: AC
  Administered 2019-03-04: 150 mg via INTRAMUSCULAR

## 2019-03-04 NOTE — Patient Instructions (Signed)

## 2019-03-04 NOTE — Progress Notes (Signed)
Pt present for anxiety, birth contorl and possible UTI. Pt stated that she would like to change her birht control from depo to antoher form due to her anxiety and scared they will shut down again and she do not want ot come out to appointments for depo injections. Pt aslos stated that she thinks she has an UTI took AZO for 2 days and her urine is orange unable to do UA. GAD-7 completed due to anxiety score 20.

## 2019-03-04 NOTE — Progress Notes (Signed)
GYNECOLOGY PROGRESS NOTE  Subjective:    Patient ID: Meredith Wood, female    DOB: Oct 02, 1975, 43 y.o.   MRN: [id number]  HPI  Patient is a 43 y.o. G26P1001 female who presents for discussion of her anxiety, management of her birth control, and possible UTI symptoms.   1. Patient reports that she does have anxiety and panic attacks, and is managed with the Xanax. Did not initiate Effexor as prescribed last visit as she notes her depression was situational.  Notes that she has not yet picked up her new prescription for Xanax yet as she has a few pills left. Notes that usually one prescription will last her for 6-12 months. Needed a refill as her refills had expired on her previous prescription. GAD score is 20 today.  2. Patient reports anxiety regarding her birth control.  She is currently on Depo Provera which she thinks is working well, but is fearful that during the winter months the COVID pandemic is going to get worse and that we will end up shutting down and quarantining again, and that she will not be able to be seen to get her next dose. Wonders what other methods of contraception she can use, or if she can administer her own injections.  3. Patient reports UTI symptoms. She has been taking Azo for 2 days without relief.   The following portions of the patient's history were reviewed and updated as appropriate: allergies, current medications, past family history, past medical history, past social history, past surgical history and problem list.  Review of Systems Pertinent items noted in HPI and remainder of comprehensive ROS otherwise negative.   Objective:   Blood pressure 109/67, pulse 97, height 5\' 4"  (1.626 m), weight 119 lb 3.2 oz (54.1 kg). General appearance: alert and no distress, appears anxious.  Abdomen: soft, non-tender.  Psychologic: thought process normal, speech normal, anxious.    Assessment:   Anxiety Contraception management UTI symptoms Tobacco abuse   Plan:   1. Anxiety - patient with baseline elevated level of anxiety. Notes that Xanax helps for her panic attacks. Again expressed that she needs a maintenance medication for her anxiety to bring her baseline stress and anxiousness down. Patient did not try Effexor.  Discussed option of trying Wellbutrin instead as it also can help her to decrease smoking (as patient has tried to quit in the past and it actually causes her worsening anxiety when she smokes as she knows it is bad for her health and wants to quit, but she smokes to deal with her anxiety). Also offered patient the option of therapy, but she notes she has significant trust issues and has PTSD from a prior therapist several years ago. Patient to return in 3 months for next Depo injection and f/u anxiety.  2. Contraception options discussed. Patient really likes her Depo injections as it has been helping her to gain weight which makes her feel better about herself. She also enjoys not having a cycle. Discussed all other options for contraception. Also advised that she could continue the use of her Depo and if another shut down occurred she would still be able to receive her injection or could be instructed on how to administer at home.  3. Patient with UTI symptoms, unable to check UA today due to use of Azo.  Will order urine culture. Can treat empirically with Cipro.    A total of 15 minutes were spent face-to-face with the patient during this encounter and over  half of that time dealt with counseling and coordination of care.   Meredith Maid, MD [facility]

## 2019-03-05 ENCOUNTER — Encounter: Payer: Self-pay | Admitting: Obstetrics and Gynecology

## 2019-05-26 ENCOUNTER — Telehealth: Payer: Self-pay | Admitting: Obstetrics and Gynecology

## 2019-05-26 ENCOUNTER — Telehealth: Payer: Self-pay

## 2019-05-26 ENCOUNTER — Other Ambulatory Visit: Payer: Self-pay

## 2019-05-26 ENCOUNTER — Ambulatory Visit (INDEPENDENT_AMBULATORY_CARE_PROVIDER_SITE_OTHER): Payer: Medicare Other | Admitting: Obstetrics and Gynecology

## 2019-05-26 ENCOUNTER — Encounter: Payer: Self-pay | Admitting: Obstetrics and Gynecology

## 2019-05-26 VITALS — BP 112/69 | HR 91 | Ht 64.0 in | Wt 108.0 lb

## 2019-05-26 DIAGNOSIS — Z113 Encounter for screening for infections with a predominantly sexual mode of transmission: Secondary | ICD-10-CM | POA: Diagnosis not present

## 2019-05-26 DIAGNOSIS — F32A Depression, unspecified: Secondary | ICD-10-CM

## 2019-05-26 DIAGNOSIS — F329 Major depressive disorder, single episode, unspecified: Secondary | ICD-10-CM | POA: Diagnosis not present

## 2019-05-26 DIAGNOSIS — F419 Anxiety disorder, unspecified: Secondary | ICD-10-CM | POA: Diagnosis not present

## 2019-05-26 MED ORDER — FLUOXETINE HCL 20 MG PO CAPS: 20.0000 mg | ORAL_CAPSULE | Freq: Two times a day (BID) | ORAL | 3 refills | Status: DC

## 2019-05-26 MED ORDER — CLONAZEPAM 0.5 MG PO TABS: 0.5000 mg | ORAL_TABLET | Freq: Two times a day (BID) | ORAL | 1 refills | Status: DC | PRN

## 2019-05-26 NOTE — Patient Instructions (Signed)
Living With Depression Everyone experiences occasional disappointment, sadness, and loss in their lives. When you are feeling down, blue, or sad for at least 2 weeks in a row, it may mean that you have depression. Depression can affect your thoughts and feelings, relationships, daily activities, and physical health. It is caused by changes in the way your brain functions. If you receive a diagnosis of depression, your health care provider will tell you which type of depression you have and what treatment options are available to you. If you are living with depression, there are ways to help you recover from it and also ways to prevent it from coming back. How to cope with lifestyle changes Coping with stress     Stress is your body's reaction to life changes and events, both good and bad. Stressful situations may include:  Getting married.  The death of a spouse.  Losing a job.  Retiring.  Having a baby. Stress can last just a few hours or it can be ongoing. Stress can play a major role in depression, so it is important to learn both how to cope with stress and how to think about it differently. Talk with your health care provider or a counselor if you would like to learn more about stress reduction. He or she may suggest some stress reduction techniques, such as:  Music therapy. This can include creating music or listening to music. Choose music that you enjoy and that inspires you.  Mindfulness-based meditation. This kind of meditation can be done while sitting or walking. It involves being aware of your normal breaths, rather than trying to control your breathing.  Centering prayer. This is a kind of meditation that involves focusing on a spiritual word or phrase. Choose a word, phrase, or sacred image that is meaningful to you and that brings you peace.  Deep breathing. To do this, expand your stomach and inhale slowly through your nose. Hold your breath for 3-5 seconds, then exhale  slowly, allowing your stomach muscles to relax.  Muscle relaxation. This involves intentionally tensing muscles then relaxing them. Choose a stress reduction technique that fits your lifestyle and personality. Stress reduction techniques take time and practice to develop. Set aside 5-15 minutes a day to do them. Therapists can offer training in these techniques. The training may be covered by some insurance plans. Other things you can do to manage stress include:  Keeping a stress diary. This can help you learn what triggers your stress and ways to control your response.  Understanding what your limits are and saying no to requests or events that lead to a schedule that is too full.  Thinking about how you respond to certain situations. You may not be able to control everything, but you can control how you react.  Adding humor to your life by watching funny films or TV shows.  Making time for activities that help you relax and not feeling guilty about spending your time this way.  Medicines Your health care provider may suggest certain medicines if he or she feels that they will help improve your condition. Avoid using alcohol and other substances that may prevent your medicines from working properly (may interact). It is also important to:  Talk with your pharmacist or health care provider about all the medicines that you take, their possible side effects, and what medicines are safe to take together.  Make it your goal to take part in all treatment decisions (shared decision-making). This includes giving input on   the side effects of medicines. It is best if shared decision-making with your health care provider is part of your total treatment plan. If your health care provider prescribes a medicine, you may not notice the full benefits of it for 4-8 weeks. Most people who are treated for depression need to be on medicine for at least 6-12 months after they feel better. If you are taking  medicines as part of your treatment, do not stop taking medicines without first talking to your health care provider. You may need to have the medicine slowly decreased (tapered) over time to decrease the risk of harmful side effects. Relationships Your health care provider may suggest family therapy along with individual therapy and drug therapy. While there may not be family problems that are causing you to feel depressed, it is still important to make sure your family learns as much as they can about your mental health. Having your family's support can help make your treatment successful. How to recognize changes in your condition Everyone has a different response to treatment for depression. Recovery from major depression happens when you have not had signs of major depression for two months. This may mean that you will start to:  Have more interest in doing activities.  Feel less hopeless than you did 2 months ago.  Have more energy.  Overeat less often, or have better or improving appetite.  Have better concentration. Your health care provider will work with you to decide the next steps in your recovery. It is also important to recognize when your condition is getting worse. Watch for these signs:  Having fatigue or low energy.  Eating too much or too little.  Sleeping too much or too little.  Feeling restless, agitated, or hopeless.  Having trouble concentrating or making decisions.  Having unexplained physical complaints.  Feeling irritable, angry, or aggressive. Get help as soon as you or your family members notice these symptoms coming back. How to get support and help from others How to talk with friends and family members about your condition  Talking to friends and family members about your condition can provide you with one way to get support and guidance. Reach out to trusted friends or family members, explain your symptoms to them, and let them know that you are  working with a health care provider to treat your depression. Financial resources Not all insurance plans cover mental health care, so it is important to check with your insurance carrier. If paying for co-pays or counseling services is a problem, search for a local or county mental health care center. They may be able to offer public mental health care services at low or no cost when you are not able to see a private health care provider. If you are taking medicine for depression, you may be able to get the generic form, which may be less expensive. Some makers of prescription medicines also offer help to patients who cannot afford the medicines they need. Follow these instructions at home:   Get the right amount and quality of sleep.  Cut down on using caffeine, tobacco, alcohol, and other potentially harmful substances.  Try to exercise, such as walking or lifting small weights.  Take over-the-counter and prescription medicines only as told by your health care provider.  Eat a healthy diet that includes plenty of vegetables, fruits, whole grains, low-fat dairy products, and lean protein. Do not eat a lot of foods that are high in solid fats, added sugars, or salt.    Keep all follow-up visits as told by your health care provider. This is important. Contact a health care provider if:  You stop taking your antidepressant medicines, and you have any of these symptoms: ? Nausea. ? Headache. ? Feeling lightheaded. ? Chills and body aches. ? Not being able to sleep (insomnia).  You or your friends and family think your depression is getting worse. Get help right away if:  You have thoughts of hurting yourself or others. If you ever feel like you may hurt yourself or others, or have thoughts about taking your own life, get help right away. You can go to your nearest emergency department or call:  Your local emergency services (911 in the [place]  A suicide crisis helpline, such as the  National Suicide Prevention Lifeline at 1-800-273-8255. This is open 24-hours a day. Summary  If you are living with depression, there are ways to help you recover from it and also ways to prevent it from coming back.  Work with your health care team to create a management plan that includes counseling, stress management techniques, and healthy lifestyle habits. This information is not intended to replace advice given to you by your health care provider. Make sure you discuss any questions you have with your health care provider. Document Revised: 08/01/2018 Document Reviewed: 03/12/2016 Elsevier Patient Education  2020 [employer]  

## 2019-05-26 NOTE — Telephone Encounter (Signed)
Pt called in and stated that she needs a return call from the nurse the pt seemed up upset and stated that she is having bad Anxiety. The pt is available to talk after 12:00

## 2019-05-26 NOTE — Telephone Encounter (Signed)
Spoke with pt and scheduled her for appointment to be seen by ac.

## 2019-05-26 NOTE — Progress Notes (Signed)
Pt present for anxiety. Pt walked into the office upset with staff due not receiving a call back from nurse that was sent at 9:30am. Pt stated that she was really upset and having really bad anxiety.  GAD-7=21.

## 2019-05-26 NOTE — Telephone Encounter (Signed)
This pts sister Meredith Wood called in for her sister Meredith Wood. The pt Meredith Wood called in this morning and left a telephone message for Meredith Wood Meredith Wood over heard Meredith Wood say that Meredith Wood came in but Meredith Wood was on the phone and then the pts sister called. The pts sister stated that she was in the car upset and couldn't move and that she thinks she is going into depression. The pts sister was calling in to help her sister. That she stated she has kids and that she was worried about her.
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
## 2019-05-28 ENCOUNTER — Ambulatory Visit: Payer: Self-pay | Admitting: Obstetrics and Gynecology

## 2019-06-04 NOTE — Telephone Encounter (Signed)
error 

## 2019-06-12 ENCOUNTER — Telehealth: Payer: Self-pay

## 2019-06-12 NOTE — Telephone Encounter (Signed)
Pt called no answer LM via VM that I was calling to check on her to see how she was doing since her visit to the office. Pt was encouraged to please call the office.

## 2019-07-28 ENCOUNTER — Other Ambulatory Visit: Payer: Self-pay | Admitting: Obstetrics and Gynecology

## 2019-07-29 NOTE — Telephone Encounter (Signed)
Have we been able to get in contact with her?

## 2019-07-29 NOTE — Telephone Encounter (Signed)
Please advise. Thanks Alyene Predmore 

## 2019-08-27 NOTE — Telephone Encounter (Signed)
Pt called no answer LM via VM to call the office. Number left on vm.

## 2019-09-26 ENCOUNTER — Other Ambulatory Visit: Payer: Self-pay | Admitting: Obstetrics and Gynecology

## 2019-11-09 NOTE — Telephone Encounter (Signed)
Pt called no answer LM via VM to call the office to speak about her medication refill.  

## 2020-02-12 ENCOUNTER — Other Ambulatory Visit: Payer: Self-pay | Admitting: Obstetrics and Gynecology

## 2020-02-15 NOTE — Telephone Encounter (Signed)
Please have patient scheduled for a for mood assessment visit prior to next refill.  Can be a televisit.

## 2020-02-18 ENCOUNTER — Telehealth: Payer: Self-pay

## 2020-02-18 NOTE — Telephone Encounter (Signed)
Please contact pt to schedule a  televisit for mood assessment.  Thanks [employer]

## 2020-02-18 NOTE — Telephone Encounter (Signed)
Called patient to get her scheduled for a mood check televisit. LVM for her to call the office to get her scheduled.

## 2020-02-25 NOTE — Telephone Encounter (Signed)
Good morning Tab, Will you please try to contact this pt again? Thanks [employer]

## 2020-03-10 ENCOUNTER — Ambulatory Visit (INDEPENDENT_AMBULATORY_CARE_PROVIDER_SITE_OTHER): Payer: Medicare Other | Admitting: Obstetrics and Gynecology

## 2020-03-10 ENCOUNTER — Encounter: Payer: Self-pay | Admitting: Obstetrics and Gynecology

## 2020-03-10 ENCOUNTER — Other Ambulatory Visit: Payer: Self-pay

## 2020-03-10 VITALS — Ht 64.0 in | Wt 105.0 lb

## 2020-03-10 DIAGNOSIS — F419 Anxiety disorder, unspecified: Secondary | ICD-10-CM

## 2020-03-10 DIAGNOSIS — F32A Depression, unspecified: Secondary | ICD-10-CM

## 2020-03-10 DIAGNOSIS — Z87891 Personal history of nicotine dependence: Secondary | ICD-10-CM | POA: Diagnosis not present

## 2020-03-10 DIAGNOSIS — Z3009 Encounter for other general counseling and advice on contraception: Secondary | ICD-10-CM

## 2020-03-10 NOTE — Progress Notes (Signed)
Televisit=Pt having televist for mood check. PHQ-9=10 and GAD-7=12. Pt stated that she was doing well.

## 2020-03-13 ENCOUNTER — Encounter: Payer: Self-pay | Admitting: Obstetrics and Gynecology

## 2020-03-13 MED ORDER — NORETHINDRONE 0.35 MG PO TABS
1.0000 | ORAL_TABLET | Freq: Every day | ORAL | 3 refills | Status: DC
Start: 2020-03-13 — End: 2020-06-14

## 2020-03-13 NOTE — Patient Instructions (Signed)
Norethindrone tablets (contraception) What is this medicine? NORETHINDRONE (nor eth IN drone) is an oral contraceptive. The product contains a female hormone known as a progestin. It is used to prevent pregnancy. This medicine may be used for other purposes; ask your health care provider or pharmacist if you have questions. COMMON BRAND NAME(S): Camila, Deblitane 28-Day, Errin, Heather, Jencycla, Jolivette, Lyza, Nor-QD, Nora-BE, Norlyroc, Ortho Micronor, Sharobel 28-Day What should I tell my health care provider before I take this medicine? They need to know if you have any of these conditions:  blood vessel disease or blood clots  breast, cervical, or vaginal cancer  diabetes  heart disease  kidney disease  liver disease  mental depression  migraine  seizures  stroke  vaginal bleeding  an unusual or allergic reaction to norethindrone, other medicines, foods, dyes, or preservatives  pregnant or trying to get pregnant  breast-feeding How should I use this medicine? Take this medicine by mouth with a glass of water. You may take it with or without food. Follow the directions on the prescription label. Take this medicine at the same time each day and in the order directed on the package. Do not take your medicine more often than directed. Contact your pediatrician regarding the use of this medicine in children. Special care may be needed. This medicine has been used in female children who have started having menstrual periods. A patient package insert for the product will be given with each prescription and refill. Read this sheet carefully each time. The sheet may change frequently. Overdosage: If you think you have taken too much of this medicine contact a poison control center or emergency room at once. NOTE: This medicine is only for you. Do not share this medicine with others. What if I miss a dose? Try not to miss a dose. Every time you miss a dose or take a dose late  your chance of pregnancy increases. When 1 pill is missed (even if only 3 hours late), take the missed pill as soon as possible and continue taking a pill each day at the regular time (use a back up method of birth control for the next 48 hours). If more than 1 dose is missed, use an additional birth control method for the rest of your pill pack until menses occurs. Contact your health care professional if more than 1 dose has been missed. What may interact with this medicine? Do not take this medicine with any of the following medications:  amprenavir or fosamprenavir  bosentan This medicine may also interact with the following medications:  antibiotics or medicines for infections, especially rifampin, rifabutin, rifapentine, and griseofulvin, and possibly penicillins or tetracyclines  aprepitant  barbiturate medicines, such as phenobarbital  carbamazepine  felbamate  modafinil  oxcarbazepine  phenytoin  ritonavir or other medicines for HIV infection or AIDS  St. John's wort  topiramate This list may not describe all possible interactions. Give your health care provider a list of all the medicines, herbs, non-prescription drugs, or dietary supplements you use. Also tell them if you smoke, drink alcohol, or use illegal drugs. Some items may interact with your medicine. What should I watch for while using this medicine? Visit your doctor or health care professional for regular checks on your progress. You will need a regular breast and pelvic exam and Pap smear while on this medicine. Use an additional method of birth control during the first cycle that you take these tablets. If you have any reason to think you   are pregnant, stop taking this medicine right away and contact your doctor or health care professional. If you are taking this medicine for hormone related problems, it may take several cycles of use to see improvement in your condition. This medicine does not protect you  against HIV infection (AIDS) or any other sexually transmitted diseases. What side effects may I notice from receiving this medicine? Side effects that you should report to your doctor or health care professional as soon as possible:  breast tenderness or discharge  pain in the abdomen, chest, groin or leg  severe headache  skin rash, itching, or hives  sudden shortness of breath  unusually weak or tired  vision or speech problems  yellowing of skin or eyes Side effects that usually do not require medical attention (report to your doctor or health care professional if they continue or are bothersome):  changes in sexual desire  change in menstrual flow  facial hair growth  fluid retention and swelling  headache  irritability  nausea  weight gain or loss This list may not describe all possible side effects. Call your doctor for medical advice about side effects. You may report side effects to FDA at 1-800-FDA-1088. Where should I keep my medicine? Keep out of the reach of children. Store at room temperature between 15 and 30 degrees C (59 and 86 degrees F). Throw away any unused medicine after the expiration date. NOTE: This sheet is a summary. It may not cover all possible information. If you have questions about this medicine, talk to your doctor, pharmacist, or health care provider.  2020 Elsevier/Gold Standard (2011-12-28 16:41:35)  
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
## 2020-05-18 ENCOUNTER — Other Ambulatory Visit: Payer: Self-pay

## 2020-05-18 ENCOUNTER — Other Ambulatory Visit: Payer: Self-pay | Admitting: Obstetrics and Gynecology

## 2020-05-20 MED ORDER — FLUOXETINE HCL 20 MG PO CAPS: 20.0000 mg | ORAL_CAPSULE | Freq: Every day | ORAL | 3 refills | Status: DC

## 2020-05-20 NOTE — Telephone Encounter (Signed)
Please advise. Thanks Quantavia Frith 

## 2020-06-14 ENCOUNTER — Ambulatory Visit (INDEPENDENT_AMBULATORY_CARE_PROVIDER_SITE_OTHER): Payer: Medicare Other | Admitting: Obstetrics and Gynecology

## 2020-06-14 ENCOUNTER — Encounter: Payer: Self-pay | Admitting: Obstetrics and Gynecology

## 2020-06-14 ENCOUNTER — Other Ambulatory Visit: Payer: Self-pay

## 2020-06-14 VITALS — BP 94/59 | HR 65 | Ht 64.0 in | Wt 103.3 lb

## 2020-06-14 DIAGNOSIS — Z3009 Encounter for other general counseling and advice on contraception: Secondary | ICD-10-CM | POA: Diagnosis not present

## 2020-06-14 DIAGNOSIS — F32A Depression, unspecified: Secondary | ICD-10-CM | POA: Diagnosis not present

## 2020-06-14 DIAGNOSIS — F419 Anxiety disorder, unspecified: Secondary | ICD-10-CM | POA: Diagnosis not present

## 2020-06-14 DIAGNOSIS — R636 Underweight: Secondary | ICD-10-CM | POA: Diagnosis not present

## 2020-06-14 MED ORDER — DEPO-SUBQ PROVERA 104 104 MG/0.65ML ~~LOC~~ SUSY: 104.0000 mg | PREFILLED_SYRINGE | SUBCUTANEOUS | 3 refills | Status: DC

## 2020-06-14 NOTE — Patient Instructions (Signed)
Managing Anxiety, Adult After being diagnosed with an anxiety disorder, you may be relieved to know why you have felt or behaved a certain way. You may also feel overwhelmed about the treatment ahead and what it will mean for your life. With care and support, you can manage this condition and recover from it. How to manage lifestyle changes Managing stress and anxiety Stress is your body's reaction to life changes and events, both good and bad. Most stress will last just a few hours, but stress can be ongoing and can lead to more than just stress. Although stress can play a major role in anxiety, it is not the same as anxiety. Stress is usually caused by something external, such as a deadline, test, or competition. Stress normally passes after the triggering event has ended.  Anxiety is caused by something internal, such as imagining a terrible outcome or worrying that something will go wrong that will devastate you. Anxiety often does not go away even after the triggering event is over, and it can become long-term (chronic) worry. It is important to understand the differences between stress and anxiety and to manage your stress effectively so that it does not lead to an anxious response. Talk with your health care provider or a counselor to learn more about reducing anxiety and stress. He or she may suggest tension reduction techniques, such as:  Music therapy. This can include creating or listening to music that you enjoy and that inspires you.  Mindfulness-based meditation. This involves being aware of your normal breaths while not trying to control your breathing. It can be done while sitting or walking.  Centering prayer. This involves focusing on a word, phrase, or sacred image that means something to you and brings you peace.  Deep breathing. To do this, expand your stomach and inhale slowly through your nose. Hold your breath for 3-5 seconds. Then exhale slowly, letting your stomach muscles  relax.  Self-talk. This involves identifying thought patterns that lead to anxiety reactions and changing those patterns.  Muscle relaxation. This involves tensing muscles and then relaxing them. Choose a tension reduction technique that suits your lifestyle and personality. These techniques take time and practice. Set aside 5-15 minutes a day to do them. Therapists can offer counseling and training in these techniques. The training to help with anxiety may be covered by some insurance plans. Other things you can do to manage stress and anxiety include:  Keeping a stress/anxiety diary. This can help you learn what triggers your reaction and then learn ways to manage your response.  Thinking about how you react to certain situations. You may not be able to control everything, but you can control your response.  Making time for activities that help you relax and not feeling guilty about spending your time in this way.  Visual imagery and yoga can help you stay calm and relax.   Medicines Medicines can help ease symptoms. Medicines for anxiety include:  Anti-anxiety drugs.  Antidepressants. Medicines are often used as a primary treatment for anxiety disorder. Medicines will be prescribed by a health care provider. When used together, medicines, psychotherapy, and tension reduction techniques may be the most effective treatment. Relationships Relationships can play a big part in helping you recover. Try to spend more time connecting with trusted friends and family members. Consider going to couples counseling, taking family education classes, or going to family therapy. Therapy can help you and others better understand your condition. How to recognize changes in your   anxiety Everyone responds differently to treatment for anxiety. Recovery from anxiety happens when symptoms decrease and stop interfering with your daily activities at home or work. This may mean that you will start to:  Have  better concentration and focus. Worry will interfere less in your daily thinking.  Sleep better.  Be less irritable.  Have more energy.  Have improved memory. It is important to recognize when your condition is getting worse. Contact your health care provider if your symptoms interfere with home or work and you feel like your condition is not improving. Follow these instructions at home: Activity  Exercise. Most adults should do the following: ? Exercise for at least 150 minutes each week. The exercise should increase your heart rate and make you sweat (moderate-intensity exercise). ? Strengthening exercises at least twice a week.  Get the right amount and quality of sleep. Most adults need 7-9 hours of sleep each night. Lifestyle  Eat a healthy diet that includes plenty of vegetables, fruits, whole grains, low-fat dairy products, and lean protein. Do not eat a lot of foods that are high in solid fats, added sugars, or salt.  Make choices that simplify your life.  Do not use any products that contain nicotine or tobacco, such as cigarettes, e-cigarettes, and chewing tobacco. If you need help quitting, ask your health care provider.  Avoid caffeine, alcohol, and certain over-the-counter cold medicines. These may make you feel worse. Ask your pharmacist which medicines to avoid.   General instructions  Take over-the-counter and prescription medicines only as told by your health care provider.  Keep all follow-up visits as told by your health care provider. This is important. Where to find support You can get help and support from these sources:  Self-help groups.  Online and community organizations.  A trusted spiritual leader.  Couples counseling.  Family education classes.  Family therapy. Where to find more information You may find that joining a support group helps you deal with your anxiety. The following sources can help you locate counselors or support groups near  you:  Mental Health America: www.mentalhealthamerica.net  Anxiety and Depression Association of America (ADAA): www.adaa.org  National Alliance on Mental Illness (NAMI): www.nami.org Contact a health care provider if you:  Have a hard time staying focused or finishing daily tasks.  Spend many hours a day feeling worried about everyday life.  Become exhausted by worry.  Start to have headaches, feel tense, or have nausea.  Urinate more than normal.  Have diarrhea. Get help right away if you have:  A racing heart and shortness of breath.  Thoughts of hurting yourself or others. If you ever feel like you may hurt yourself or others, or have thoughts about taking your own life, get help right away. You can go to your nearest emergency department or call:  Your local emergency services (911 in the [place]  A suicide crisis helpline, such as the National Suicide Prevention Lifeline at 1-800-273-8255. This is open 24 hours a day. Summary  Taking steps to learn and use tension reduction techniques can help calm you and help prevent triggering an anxiety reaction.  When used together, medicines, psychotherapy, and tension reduction techniques may be the most effective treatment.  Family, friends, and partners can play a big part in helping you recover from an anxiety disorder. This information is not intended to replace advice given to you by your health care provider. Make sure you discuss any questions you have with your health care provider. Document   Revised: 09/09/2018 Document Reviewed: 09/09/2018 Elsevier Patient Education  2021 Elsevier Inc.  http://APA.org/depression-guideline"> https://clinicalkey.com"> http://point-of-care.elsevierperformancemanager.com/skills/"> http://point-of-care.elsevierperformancemanager.com">  Managing Depression, Adult Depression is a mental health condition that affects your thoughts, feelings, and actions. Being diagnosed with depression can  bring you relief if you did not know why you have felt or behaved a certain way. It could also leave you feeling overwhelmed with uncertainty about your future. Preparing yourself to manage your symptoms can help you feel more positive about your future. How to manage lifestyle changes Managing stress Stress is your body's reaction to life changes and events, both good and bad. Stress can add to your feelings of depression. Learning to manage your stress can help lessen your feelings of depression. Try some of the following approaches to reducing your stress (stress reduction techniques):  Listen to music that you enjoy and that inspires you.  Try using a meditation app or take a meditation class.  Develop a practice that helps you connect with your spiritual self. Walk in nature, pray, or go to a place of worship.  Do some deep breathing. To do this, inhale slowly through your nose. Pause at the top of your inhale for a few seconds and then exhale slowly, letting your muscles relax.  Practice yoga to help relax and work your muscles. Choose a stress reduction technique that suits your lifestyle and personality. These techniques take time and practice to develop. Set aside 5-15 minutes a day to do them. Therapists can offer training in these techniques. Other things you can do to manage stress include:  Keeping a stress diary.  Knowing your limits and saying no when you think something is too much.  Paying attention to how you react to certain situations. You may not be able to control everything, but you can change your reaction.  Adding humor to your life by watching funny films or TV shows.  Making time for activities that you enjoy and that relax you.   Medicines Medicines, such as antidepressants, are often a part of treatment for depression.  Talk with your pharmacist or health care provider about all the medicines, supplements, and herbal products that you take, their possible  side effects, and what medicines and other products are safe to take together.  Make sure to report any side effects you may have to your health care provider. Relationships Your health care provider may suggest family therapy, couples therapy, or individual therapy as part of your treatment. How to recognize changes Everyone responds differently to treatment for depression. As you recover from depression, you may start to:  Have more interest in doing activities.  Feel less hopeless.  Have more energy.  Overeat less often, or have a better appetite.  Have better mental focus. It is important to recognize if your depression is not getting better or is getting worse. The symptoms you had in the beginning may return, such as:  Tiredness (fatigue) or low energy.  Eating too much or too little.  Sleeping too much or too little.  Feeling restless, agitated, or hopeless.  Trouble focusing or making decisions.  Unexplained physical complaints.  Feeling irritable, angry, or aggressive. If you or your family members notice these symptoms coming back, let your health care provider know right away. Follow these instructions at home: Activity  Try to get some form of exercise each day, such as walking, biking, swimming, or lifting weights.  Practice stress reduction techniques.  Engage your mind by taking a class or doing some   or doing some volunteer work.   Lifestyle  Get the right amount and quality of sleep.  Cut down on using caffeine, tobacco, alcohol, and other potentially harmful substances.  Eat a healthy diet that includes plenty of vegetables, fruits, whole grains, low-fat dairy products, and lean protein. Do not eat a lot of foods that are high in solid fats, added sugars, or salt (sodium). General instructions  Take over-the-counter and prescription medicines only as told by your health care provider.  Keep all follow-up visits as told by your health care provider. This is  important. Where to find support Talking to others Friends and family members can be sources of support and guidance. Talk to trusted friends or family members about your condition. Explain your symptoms to them, and let them know that you are working with a health care provider to treat your depression. Tell friends and family members how they also can be helpful.   Finances  Find appropriate mental health providers that fit with your financial situation.  Talk with your health care provider about options to get reduced prices on your medicines. Where to find more information You can find support in your area from:  Anxiety and Depression Association of America (ADAA): www.adaa.org  Mental Health America: www.mentalhealthamerica.net  [employer] on Mental Illness: www.nami.org Contact a health care provider if:  You stop taking your antidepressant medicines, and you have any of these symptoms: ? Nausea. ? Headache. ? Light-headedness. ? Chills and body aches. ? Not being able to sleep (insomnia).  You or your friends and family think your depression is getting worse. Get help right away if:  You have thoughts of hurting yourself or others. If you ever feel like you may hurt yourself or others, or have thoughts about taking your own life, get help right away. Go to your nearest emergency department or:  Call your local emergency services (911 in the [place]  Call a suicide crisis helpline, such as the National Suicide Prevention Lifeline at 640-726-0943. This is open 24 hours a day in the [place]  Text the Crisis Text Line at (929)378-0825 (in the [place] Summary  If you are diagnosed with depression, preparing yourself to manage your symptoms is a good way to feel positive about your future.  Work with your health care provider on a management plan that includes stress reduction techniques, medicines (if applicable), therapy, and healthy lifestyle habits.  Keep talking with  your health care provider about how your treatment is working.  If you have thoughts about taking your own life, call a suicide crisis helpline or text a crisis text line. This information is not intended to replace advice given to you by your health care provider. Make sure you discuss any questions you have with your health care provider. Document Revised: 02/18/2019 Document Reviewed: 02/18/2019 Elsevier Patient Education  2021 [employer].

## 2020-06-14 NOTE — Progress Notes (Signed)
GYNECOLOGY PROGRESS NOTE  Subjective:    Patient ID: Meredith Wood, female    DOB: May 02, 1975, 45 y.o.   MRN: [id number]  HPI  Patient is a 45 y.o. G54P1001 female who presents for 3 month f/ of anxiety and depression.  She states that she is having some concerns. Notes she is concerned about her 53 year old daughter, who is now living a "band life". Notes her son is acting up in school.   She also expresses that she is worried about her weight. She currently weighs 103 lbs. Usually weighs between 108-112 lbs. She notes when she is stressed, she has no appetite. Also feels like gagging when eating.    The following portions of the patient's history were reviewed and updated as appropriate: allergies, current medications, past family history, past medical history, past social history, past surgical history and problem list.  Review of Systems Pertinent items noted in HPI and remainder of comprehensive ROS otherwise negative.   Objective:   Blood pressure (!) 94/59, pulse 65, weight 103 lb 4.8 oz (46.9 kg). Body mass index is 17.73 kg/m.  General appearance: alert, cooperative and no distress   Depression screen [facility] 2/9 06/14/2020 03/10/2020 05/26/2019  Decreased Interest 3 1 3   Down, Depressed, Hopeless 2 0 3  PHQ - 2 Score 5 1 6   Altered sleeping 3 1 3   Tired, decreased energy 3 1 3   Change in appetite 3 3 3   Feeling bad or failure about yourself  2 0 3  Trouble concentrating 1 1 3   Moving slowly or fidgety/restless 2 3 3   Suicidal thoughts 1 0 3  PHQ-9 Score 20 10 27   Difficult doing work/chores - Somewhat difficult Extremely dIfficult     GAD 7 : Generalized Anxiety Score 06/14/2020 03/10/2020 05/26/2019 03/04/2019  Nervous, Anxious, on Edge 3 2 3 3   Control/stop worrying 3 1 3 3   Worry too much - different things 3 1 3 3   Trouble relaxing 3 3 3 3   Restless 1 3 3 2   Easily annoyed or irritable 3 1 3 3   Afraid - awful might happen 3 1 3 3   Total GAD 7 Score 19 12  21 20   Anxiety Difficulty - Somewhat difficult - Extremely difficult     Assessment:   1. Anxiety and depression   2. Underweight due to inadequate caloric intake   3. Encounter for counseling regarding contraception    Plan:   1. Anxiety and depression - elevated score currently, citing family issues as current stressor.  Advised that these changes are likely temporary, children exploring boundaries, also dealing with the parental separation. Discussed other ways to manage anxiety and depression including CBD oil, meditation. Has declined counseling in the past.   Can also increase medications (currently on Klonopin/Prozac).  2. Underweight due to inadequate intake and stress. Discussed stress relievers (see above #1), and methods to increase dietary intake or supplementation (I.e. Boost/Ensure supplements, protein bars/shakes), vegetarian options for increasing protein, peanut butter intake.  3. Assessed patient regarding contraception. Notes that she never initiated the progesterone-only pills.  She has previously been on Depo Provera, and noted some weight gain. Would like to resume this again.  Discussed option of self-injections. Patient enthusiastic about this option. Will prescribe.   Follow up in 3 months (televisit) to reassess symptoms.    A total of 30 minutes were spent face-to-face with the patient during this encounter and over half of that time dealt with counseling and  coordination of care.   Hildred Laser, MD [facility]

## 2020-10-17 ENCOUNTER — Emergency Department: Payer: Medicare Other

## 2020-10-17 ENCOUNTER — Encounter: Payer: Self-pay | Admitting: Emergency Medicine

## 2020-10-17 ENCOUNTER — Emergency Department
Admission: EM | Admit: 2020-10-17 | Discharge: 2020-10-17 | Disposition: A | Discharge status: Home / Self Care | Payer: Medicare Other | Attending: Emergency Medicine | Admitting: Emergency Medicine

## 2020-10-17 ENCOUNTER — Other Ambulatory Visit: Payer: Self-pay

## 2020-10-17 DIAGNOSIS — Z87891 Personal history of nicotine dependence: Secondary | ICD-10-CM | POA: Diagnosis not present

## 2020-10-17 DIAGNOSIS — S060X0A Concussion without loss of consciousness, initial encounter: Secondary | ICD-10-CM | POA: Diagnosis not present

## 2020-10-17 DIAGNOSIS — Z91040 Latex allergy status: Secondary | ICD-10-CM | POA: Insufficient documentation

## 2020-10-17 DIAGNOSIS — S20211A Contusion of right front wall of thorax, initial encounter: Secondary | ICD-10-CM | POA: Insufficient documentation

## 2020-10-17 DIAGNOSIS — S299XXA Unspecified injury of thorax, initial encounter: Secondary | ICD-10-CM | POA: Diagnosis present

## 2020-10-17 MED ORDER — ONDANSETRON 4 MG PO TBDP: 4.0000 mg | ORAL_TABLET | Freq: Three times a day (TID) | ORAL | 0 refills | Status: DC | PRN

## 2020-10-17 MED ORDER — ONDANSETRON 4 MG PO TBDP
4.0000 mg | ORAL_TABLET | Freq: Once | ORAL | Status: AC
  Administered 2020-10-17: 4 mg via ORAL
  Filled 2020-10-17: qty 1

## 2020-10-17 MED ORDER — KETOROLAC TROMETHAMINE 60 MG/2ML IM SOLN
30.0000 mg | Freq: Once | INTRAMUSCULAR | Status: AC
  Administered 2020-10-17: 30 mg via INTRAMUSCULAR
  Filled 2020-10-17: qty 2

## 2020-10-17 NOTE — ED Notes (Signed)
Per first nurse Megan and Dr. Dolores Frame, investigator coming to speak with patient before discharge.
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
## 2020-10-17 NOTE — Discharge Instructions (Addendum)
You may take Zofran as needed for nausea.  Return to the ER for worsening symptoms, persistent vomiting, difficulty breathing or other concerns

## 2020-10-17 NOTE — ED Notes (Addendum)
[employer] officer, political party at bedside speaking with patient.
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
## 2020-10-17 NOTE — ED Notes (Signed)
EDP at bedside  

## 2021-02-28 ENCOUNTER — Other Ambulatory Visit: Payer: Self-pay

## 2021-02-28 ENCOUNTER — Other Ambulatory Visit (HOSPITAL_COMMUNITY)
Admission: RE | Admit: 2021-02-28 | Discharge: 2021-02-28 | Disposition: A | Discharge status: Home / Self Care | Payer: Medicare Other | Source: Ambulatory Visit | Attending: Obstetrics and Gynecology | Admitting: Obstetrics and Gynecology

## 2021-02-28 ENCOUNTER — Encounter: Payer: Self-pay | Admitting: Obstetrics and Gynecology

## 2021-02-28 ENCOUNTER — Ambulatory Visit (INDEPENDENT_AMBULATORY_CARE_PROVIDER_SITE_OTHER): Payer: PRIVATE HEALTH INSURANCE | Admitting: Obstetrics and Gynecology

## 2021-02-28 VITALS — BP 124/80 | HR 99 | Resp 16 | Ht 64.0 in | Wt 108.6 lb

## 2021-02-28 DIAGNOSIS — Z01419 Encounter for gynecological examination (general) (routine) without abnormal findings: Secondary | ICD-10-CM | POA: Insufficient documentation

## 2021-02-28 DIAGNOSIS — Z114 Encounter for screening for human immunodeficiency virus [HIV]: Secondary | ICD-10-CM | POA: Diagnosis not present

## 2021-02-28 DIAGNOSIS — Z1151 Encounter for screening for human papillomavirus (HPV): Secondary | ICD-10-CM | POA: Diagnosis not present

## 2021-02-28 DIAGNOSIS — F419 Anxiety disorder, unspecified: Secondary | ICD-10-CM

## 2021-02-28 DIAGNOSIS — Z113 Encounter for screening for infections with a predominantly sexual mode of transmission: Secondary | ICD-10-CM | POA: Insufficient documentation

## 2021-02-28 DIAGNOSIS — Z124 Encounter for screening for malignant neoplasm of cervix: Secondary | ICD-10-CM | POA: Insufficient documentation

## 2021-02-28 DIAGNOSIS — F32A Depression, unspecified: Secondary | ICD-10-CM

## 2021-02-28 NOTE — Patient Instructions (Signed)
Safe Sex Practicing safe sex means taking steps before and during sex to reduce your risk of: Getting an STI (sexually transmitted infection). Giving your partner an STI. Unwanted or unplanned pregnancy. How to practice safe sex Ways you can practice safe sex  Limit your sexual partners to only one partner who is having sex with only you. Avoid using alcohol and drugs before having sex. Alcohol and drugs can affect your judgment. Before having sex with a new partner: Talk to your partner about past partners, past STIs, and drug use. Get screened for STIs and discuss the results with your partner. Ask your partner to get screened too. Check your body regularly for sores, blisters, rashes, or unusual discharge. If you notice any of these problems, visit your health care provider. Avoid sexual contact if you have symptoms of an infection or you are being treated for an STI. While having sex, use a condom. Make sure to: Use a condom every time you have vaginal, oral, or anal sex. Both females and males should wear condoms during oral sex. Keep condoms in place from the beginning to the end of sexual activity. Use a latex condom, if possible. Latex condoms offer the best protection. Use only water-based lubricants with a condom. Using petroleum-based lubricants or oils will weaken the condom and increase the chance that it will break. Ways your health care provider can help you practice safe sex  See your health care provider for regular screenings, exams, and tests for STIs. Talk with your health care provider about what kind of birth control (contraception) is best for you. Get vaccinated against hepatitis B and human papillomavirus (HPV). If you are at risk of being infected with HIV (human immunodeficiency virus), talk with your health care provider about taking a prescription medicine to prevent HIV infection. You are at risk for HIV if you: Are a man who has sex with other men. Are  sexually active with more than one partner. Take drugs by injection. Have a sex partner who has HIV. Have unprotected sex. Have sex with someone who has sex with both men and women. Have had an STI. Follow these instructions at home: Take over-the-counter and prescription medicines only as told by your health care provider. Keep all follow-up visits. This is important. Where to find more information Centers for Disease Control and Prevention: www.cdc.gov Planned Parenthood: www.plannedparenthood.org Office on Women's Health: www.womenshealth.gov Summary Practicing safe sex means taking steps before and during sex to reduce your risk getting an STI, giving your partner an STI, and having an unwanted or unplanned pregnancy. Before having sex with a new partner, talk to your partner about past partners, past STIs, and drug use. Use a condom every time you have vaginal, oral, or anal sex. Both females and males should wear condoms during oral sex. Check your body regularly for sores, blisters, rashes, or unusual discharge. If you notice any of these problems, visit your health care provider. See your health care provider for regular screenings, exams, and tests for STIs. This information is not intended to replace advice given to you by your health care provider. Make sure you discuss any questions you have with your health care provider. Document Revised: 09/14/2019 Document Reviewed: 09/14/2019 Elsevier Patient Education  2022 Elsevier Inc.  
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
## 2021-03-01 LAB — HEPATITIS C ANTIBODY: Hep C Virus Ab: <0.1 s/co ratio (ref 0.0–0.9)

## 2021-03-01 LAB — RPR: RPR Ser Ql: NONREACTIVE

## 2021-03-01 LAB — HIV ANTIBODY (ROUTINE TESTING W REFLEX): HIV Screen 4th Generation wRfx: NONREACTIVE

## 2021-03-02 LAB — CERVICOVAGINAL ANCILLARY ONLY
Chlamydia: NEGATIVE
Comment: NEGATIVE
Comment: NEGATIVE
Comment: NORMAL
Neisseria Gonorrhea: NEGATIVE
Trichomonas: NEGATIVE

## 2021-03-03 ENCOUNTER — Encounter: Payer: Medicare Other | Admitting: Obstetrics and Gynecology

## 2021-03-03 LAB — CYTOLOGY - PAP
Adequacy: ABSENT
Comment: NEGATIVE
Diagnosis: NEGATIVE
High risk HPV: NEGATIVE

## 2021-03-28 ENCOUNTER — Encounter: Payer: Self-pay | Admitting: Obstetrics and Gynecology

## 2021-03-28 ENCOUNTER — Other Ambulatory Visit (HOSPITAL_COMMUNITY)
Admission: RE | Admit: 2021-03-28 | Discharge: 2021-03-28 | Disposition: A | Discharge status: Home / Self Care | Payer: Medicare Other | Source: Ambulatory Visit | Attending: Obstetrics and Gynecology | Admitting: Obstetrics and Gynecology

## 2021-03-28 ENCOUNTER — Ambulatory Visit (INDEPENDENT_AMBULATORY_CARE_PROVIDER_SITE_OTHER): Payer: Medicare Other | Admitting: Obstetrics and Gynecology

## 2021-03-28 ENCOUNTER — Other Ambulatory Visit: Payer: Self-pay

## 2021-03-28 VITALS — BP 101/64 | HR 85 | Ht 64.0 in | Wt 110.4 lb

## 2021-03-28 DIAGNOSIS — N898 Other specified noninflammatory disorders of vagina: Secondary | ICD-10-CM | POA: Insufficient documentation

## 2021-03-28 NOTE — Progress Notes (Signed)
    GYNECOLOGY PROGRESS NOTE  Subjective:    Patient ID: Meredith Wood, female    DOB: [birth year], 45 y.o.   MRN: [id number]  HPI  Patient is a 45 y.o. G7P1001 female who presents for complaints of vaginal irritaiton and dryness. Had STD check several weeks ago which was negative. Notes that she thought she may have been developing a yeast infection last week so used a boric acid suppository.  States that after this she began noting the irritation.  Began using Rephresh vaginal moisturizers at the recommendation of her sister. Notes that it does help some, but when not using, the irritation returns.    The following portions of the patient's history were reviewed and updated as appropriate: allergies, current medications, past family history, past medical history, past social history, past surgical history, and problem list.  Review of Systems Pertinent items noted in HPI and remainder of comprehensive ROS otherwise negative.   Objective:   Blood pressure 101/64, pulse 85, height 5\' 4"  (1.626 m), weight 110 lb 6 oz (50.1 kg).  Body mass index is 18.95 kg/m. General appearance: alert and no distress Remainder of exam deferred.    Assessment:   1. Vaginal irritation    Plan:   - Advised on other causes of vaginal irritation. Will perform vaginitis screen (BV,yeast) and treat if positive findings.  Patient allowed to self-swab. Discussed use of vaginal coconut oil as well as Rephresh for vaginal irritation.   Return to clinic for any scheduled appointments or for any gynecologic concerns as needed.    , MD [facility]

## 2021-03-28 NOTE — Patient Instructions (Signed)

## 2021-03-29 LAB — CERVICOVAGINAL ANCILLARY ONLY
Bacterial Vaginitis (gardnerella): NEGATIVE
Candida Glabrata: NEGATIVE
Candida Vaginitis: NEGATIVE
Comment: NEGATIVE
Comment: NEGATIVE
Comment: NEGATIVE

## 2021-05-26 ENCOUNTER — Other Ambulatory Visit: Payer: Self-pay | Admitting: Obstetrics and Gynecology

## 2021-05-26 DIAGNOSIS — Z1231 Encounter for screening mammogram for malignant neoplasm of breast: Secondary | ICD-10-CM

## 2021-05-31 ENCOUNTER — Other Ambulatory Visit: Payer: Self-pay | Admitting: Obstetrics and Gynecology

## 2021-06-01 NOTE — Telephone Encounter (Signed)
LM with patient to confirm she is still using this form of contraception; waiting for pt response.

## 2021-06-02 NOTE — Telephone Encounter (Signed)
2nd attempt to contact patient; no answer, unable to LVM.

## 2021-06-05 NOTE — Telephone Encounter (Signed)
Pt confirmed she is still using this medication; refill sent.

## 2021-06-16 ENCOUNTER — Other Ambulatory Visit: Payer: Self-pay | Admitting: Obstetrics and Gynecology

## 2021-06-16 ENCOUNTER — Ambulatory Visit
Admission: RE | Admit: 2021-06-16 | Discharge: 2021-06-16 | Disposition: A | Discharge status: Home / Self Care | Payer: Medicare Other | Source: Ambulatory Visit | Attending: Obstetrics and Gynecology | Admitting: Obstetrics and Gynecology

## 2021-06-16 DIAGNOSIS — Z1231 Encounter for screening mammogram for malignant neoplasm of breast: Secondary | ICD-10-CM

## 2021-06-19 ENCOUNTER — Other Ambulatory Visit: Payer: Self-pay | Admitting: Obstetrics and Gynecology

## 2021-06-19 DIAGNOSIS — R928 Other abnormal and inconclusive findings on diagnostic imaging of breast: Secondary | ICD-10-CM

## 2021-07-07 ENCOUNTER — Ambulatory Visit: Payer: Medicare Other

## 2021-07-07 ENCOUNTER — Other Ambulatory Visit: Payer: Self-pay | Admitting: Obstetrics and Gynecology

## 2021-07-07 ENCOUNTER — Ambulatory Visit
Admission: RE | Admit: 2021-07-07 | Discharge: 2021-07-07 | Disposition: A | Discharge status: Home / Self Care | Payer: Medicare Other | Source: Ambulatory Visit | Attending: Obstetrics and Gynecology | Admitting: Obstetrics and Gynecology

## 2021-07-07 DIAGNOSIS — R928 Other abnormal and inconclusive findings on diagnostic imaging of breast: Secondary | ICD-10-CM

## 2021-08-03 ENCOUNTER — Encounter: Payer: Self-pay | Admitting: Obstetrics and Gynecology

## 2021-08-04 MED ORDER — CLONAZEPAM 0.5 MG PO TABS: 0.5000 mg | ORAL_TABLET | Freq: Two times a day (BID) | ORAL | 1 refills | Status: DC | PRN

## 2021-08-04 MED ORDER — FLUOXETINE HCL 20 MG PO CAPS: 20.0000 mg | ORAL_CAPSULE | Freq: Every day | ORAL | 3 refills | Status: DC

## 2022-05-29 ENCOUNTER — Other Ambulatory Visit: Payer: Self-pay | Admitting: Obstetrics and Gynecology

## 2022-05-30 ENCOUNTER — Other Ambulatory Visit: Payer: Self-pay

## 2022-05-30 MED ORDER — DEPO-SUBQ PROVERA 104 104 MG/0.65ML ~~LOC~~ SUSY: 104.0000 mg | PREFILLED_SYRINGE | SUBCUTANEOUS | 3 refills | Status: AC

## 2022-05-30 NOTE — Patient Instructions (Signed)

## 2022-05-30 NOTE — Telephone Encounter (Signed)
Patient contacted office requesting a refill on Depo Provera, patients last physical was on  10/21/2018 patient advised that this will need to be scheduled prior to refill. Patient has scheduled an appt for 05/31/22 for her annual. KW

## 2022-05-30 NOTE — Progress Notes (Signed)
GYNECOLOGY ANNUAL PHYSICAL EXAM PROGRESS NOTE  Subjective:    Meredith Wood is a 47 y.o. G80P1001 female who presents for an annual exam. The patient has no complaints today. The patient is sexually active. The patient participates in regular exercise: yes. Has the patient ever been transfused or tattooed?: no. The patient reports that there is not domestic violence in her life.   Meredith Wood has questions regarding her fertility.  Is currently on Depo Provera for contraception.  Understands that she would be considered an elderly pregnancy.   The pregnancy intention screening data noted above was reviewed. Potential methods of contraception were discussed. The patient elected to proceed with No data recorded.  Menstrual History: Menarche age: 65 No LMP recorded. Patient has had an injection.     Gynecologic History:  Contraception: Depo-Provera injections History of STI's:  Last Pap: 02/28/2021. Results were: normal.  Denies h/o abnormal pap smears. Last mammogram: 06/16/2021. Results were: normal     Office Visit from 05/31/2022 in [facility] at [facility]   05/31/2022   1330  Pregnancy Intention Screening   Does the patient want to become pregnant in the next year? Ok Either Way  Does the patient's partner want to become pregnant in the next year? Ok Either Way  Would the patient like to discuss contraceptive options today? No  Contraception Wrap Up   Current Method Hormonal Injection  End Method Hormonal Injection  Contraception Counseling Provided No  How was the end contraceptive method provided? N/A     The pregnancy intention screening data noted above was reviewed. Potential methods of contraception were discussed. The patient elected to proceed with Hormonal Injection.    OB History  Gravida Para Term Preterm AB Living  1 1 1 $ 0 0 1  SAB IAB Ectopic Multiple Live Births  0 0 0 0 1    # Outcome Date GA Lbr Len/2nd Weight Sex Delivery Anes  PTL Lv  1 Term 12/03/01   6 lb 3 oz (2.807 kg) F CS-LTranv  N LIV    Past Medical History:  Diagnosis Date   allergy    unsure of what it is   Anxiety    Fibromyalgia    Post traumatic stress disorder (PTSD)     Past Surgical History:  Procedure Laterality Date   AUGMENTATION MAMMAPLASTY     BREAST SURGERY     CESAREAN SECTION     PLACEMENT OF BREAST IMPLANTS     TOOTH EXTRACTION      Family History  Problem Relation Age of Onset   Lupus Mother    Cancer Father    Breast cancer Paternal Aunt    Breast cancer Paternal Grandmother     Social History   Socioeconomic History   Marital status: Unknown    Spouse name: Not on file   Number of children: Not on file   Years of education: Not on file   Highest education level: Not on file  Occupational History   Not on file  Tobacco Use   Smoking status: Some Days    Types: Cigarettes   Smokeless tobacco: Never  Vaping Use   Vaping Use: Never used  Substance and Sexual Activity   Alcohol use: Yes    Comment: occas   Drug use: No   Sexual activity: Not Currently    Birth control/protection: None, Injection  Other Topics Concern   Not on file  Social History Narrative   Not on file  Social Determinants of Health   Financial Resource Strain: Not on file  Food Insecurity: Not on file  Transportation Needs: Not on file  Physical Activity: Not on file  Stress: Not on file  Social Connections: Not on file  Intimate Partner Violence: Not on file    Current Outpatient Medications on File Prior to Visit  Medication Sig Dispense Refill   clonazePAM (KLONOPIN) 0.5 MG tablet Take 1 tablet (0.5 mg total) by mouth 2 (two) times daily as needed for anxiety. 30 tablet 1   diphenhydrAMINE (BENADRYL) 25 MG tablet Take 1 tablet (25 mg total) by mouth every 8 (eight) hours as needed. 9 tablet 0   EPINEPHrine 0.3 mg/0.3 mL IJ SOAJ injection Inject 0.3 mLs (0.3 mg total) into the muscle once. 1 Device 1   FLUoxetine (PROZAC)  20 MG capsule Take 1 capsule (20 mg total) by mouth daily. 90 capsule 3   medroxyPROGESTERone (DEPO-SUBQ PROVERA 104) 104 MG/0.65ML injection Inject 0.65 mLs (104 mg total) into the skin every 3 (three) months. 0.65 mL 3   No current facility-administered medications on file prior to visit.    Allergies  Allergen Reactions   Pineapple Swelling    Tongue swells    Kiwi Extract Swelling    Tongue swells    Latex Swelling    Cant use latex condoms due to itching, burning and swelling of the vaginal area     Review of Systems Constitutional: negative for chills, fatigue, fevers and sweats Eyes: negative for irritation, redness and visual disturbance Ears, nose, mouth, throat, and face: negative for hearing loss, nasal congestion, snoring and tinnitus Respiratory: negative for asthma, cough, sputum Cardiovascular: negative for chest pain, dyspnea, exertional chest pressure/discomfort, irregular heart beat, palpitations and syncope Gastrointestinal: negative for abdominal pain, change in bowel habits, nausea and vomiting Genitourinary: negative for abnormal menstrual periods, genital lesions, sexual problems and vaginal discharge, dysuria and urinary incontinence Integument/breast: negative for breast lump, breast tenderness and nipple discharge Hematologic/lymphatic: negative for bleeding and easy bruising Musculoskeletal:negative for back pain and muscle weakness Neurological: negative for dizziness, headaches, vertigo and weakness Endocrine: negative for diabetic symptoms including polydipsia, polyuria and skin dryness Allergic/Immunologic: negative for hay fever and urticaria      Objective:  Blood pressure 109/80, pulse 70, resp. rate 16, height 5' 4"$  (1.626 m), weight 121 lb 6.4 oz (55.1 kg). Body mass index is 20.84 kg/m.    General Appearance:    Alert, cooperative, no distress, appears stated age  Head:    Normocephalic, without obvious abnormality, atraumatic  Eyes:     PERRL, conjunctiva/corneas clear, EOM's intact, both eyes  Ears:    Normal external ear canals, both ears  Nose:   Nares normal, septum midline, mucosa normal, no drainage or sinus tenderness  Throat:   Lips, mucosa, and tongue normal; teeth and gums normal  Neck:   Supple, symmetrical, trachea midline, no adenopathy; thyroid: no enlargement/tenderness/nodules; no carotid bruit or JVD  Back:     Symmetric, no curvature, ROM normal, no CVA tenderness  Lungs:     Clear to auscultation bilaterally, respirations unlabored  Chest Wall:    No tenderness or deformity   Heart:    Regular rate and rhythm, S1 and S2 normal, no murmur, rub or gallop  Breast Exam:    No tenderness, masses, or nipple abnormality  Abdomen:     Soft, non-tender, bowel sounds active all four quadrants, no masses, no organomegaly.    Genitalia:    Pelvic:external  genitalia normal, vagina without lesions, discharge, or tenderness, rectovaginal septum  normal. Cervix normal in appearance, no cervical motion tenderness, no adnexal masses or tenderness.  Uterus normal size, shape, mobile, regular contours, nontender.  Rectal:    Normal external sphincter.  No hemorrhoids appreciated. Internal exam not done.   Extremities:   Extremities normal, atraumatic, no cyanosis or edema  Pulses:   2+ and symmetric all extremities  Skin:   Skin color, texture, turgor normal, no rashes or lesions  Lymph nodes:   Cervical, supraclavicular, and axillary nodes normal  Neurologic:   CNII-XII intact, normal strength, sensation and reflexes throughout   .  Labs:  Lab Results  Component Value Date   WBC 4.5 08/23/2017   HGB 11.7 08/23/2017   HCT 35.8 08/23/2017   MCV 85 08/23/2017   PLT 275 08/23/2017    Lab Results  Component Value Date   CREATININE 0.91 08/23/2017   BUN 10 08/23/2017   NA 142 08/23/2017   K 4.4 08/23/2017   CL 105 08/23/2017   CO2 25 08/23/2017    Lab Results  Component Value Date   ALT 7 08/23/2017   AST 11  08/23/2017   ALKPHOS 50 08/23/2017   BILITOT 0.4 08/23/2017    Lab Results  Component Value Date   TSH 1.300 09/30/2014     Assessment:   1. Encounter for well woman exam with routine gynecological exam   2. Screen for STD (sexually transmitted disease)   3. Screening for diabetes mellitus (DM)   4. Screening cholesterol level   5. Need for hepatitis C screening test   6. Need for hepatitis B screening test   7. Encounter for screening for HIV   8. Encounter for lipid screening for cardiovascular disease   9. Screening for thyroid disorder   10. Encounter for screening mammogram for breast cancer   11. Encounter for management and injection of depo-Provera      Plan:  Blood tests: See orders. Breast self exam technique reviewed and patient encouraged to perform self-exam monthly. Contraception: Depo-Provera injections. Or injection today.  Discussed healthy lifestyle modifications. Mammogram ordered Pap smear  UTD . COVID vaccination status: eligible for booster. Patient desires STD screening.  Discussed fertility success rate based on age. Patient with no major medical risk factors. Advised that it may take some time for fertility to return after use of Depo Provera.   Follow up in 1 year for annual exam   [name], MD [place]

## 2022-05-31 ENCOUNTER — Other Ambulatory Visit (HOSPITAL_COMMUNITY)
Admission: RE | Admit: 2022-05-31 | Discharge: 2022-05-31 | Disposition: A | Discharge status: Home / Self Care | Payer: Medicare Other | Source: Ambulatory Visit | Attending: Obstetrics and Gynecology | Admitting: Obstetrics and Gynecology

## 2022-05-31 ENCOUNTER — Ambulatory Visit (INDEPENDENT_AMBULATORY_CARE_PROVIDER_SITE_OTHER): Payer: Medicare Other | Admitting: Obstetrics and Gynecology

## 2022-05-31 ENCOUNTER — Encounter: Payer: Self-pay | Admitting: Obstetrics and Gynecology

## 2022-05-31 VITALS — BP 156/90 | HR 113 | Resp 16 | Ht 64.0 in | Wt 121.4 lb

## 2022-05-31 DIAGNOSIS — Z114 Encounter for screening for human immunodeficiency virus [HIV]: Secondary | ICD-10-CM

## 2022-05-31 DIAGNOSIS — Z113 Encounter for screening for infections with a predominantly sexual mode of transmission: Secondary | ICD-10-CM | POA: Diagnosis present

## 2022-05-31 DIAGNOSIS — Z3042 Encounter for surveillance of injectable contraceptive: Secondary | ICD-10-CM

## 2022-05-31 DIAGNOSIS — Z1159 Encounter for screening for other viral diseases: Secondary | ICD-10-CM

## 2022-05-31 DIAGNOSIS — Z1329 Encounter for screening for other suspected endocrine disorder: Secondary | ICD-10-CM

## 2022-05-31 DIAGNOSIS — Z131 Encounter for screening for diabetes mellitus: Secondary | ICD-10-CM

## 2022-05-31 DIAGNOSIS — Z01419 Encounter for gynecological examination (general) (routine) without abnormal findings: Secondary | ICD-10-CM

## 2022-05-31 DIAGNOSIS — Z13220 Encounter for screening for lipoid disorders: Secondary | ICD-10-CM

## 2022-05-31 DIAGNOSIS — Z1231 Encounter for screening mammogram for malignant neoplasm of breast: Secondary | ICD-10-CM

## 2022-05-31 MED ORDER — MEDROXYPROGESTERONE ACETATE 150 MG/ML IM SUSP
150.0000 mg | Freq: Once | INTRAMUSCULAR | Status: AC
  Administered 2022-05-31: 150 mg via INTRAMUSCULAR

## 2022-05-31 MED ORDER — EPINEPHRINE 0.3 MG/0.3ML IJ SOAJ: 0.3000 mg | Freq: Once | INTRAMUSCULAR | 0 refills | Status: AC

## 2022-06-01 LAB — LIPID PANEL
Chol/HDL Ratio: 2.2 ratio (ref 0.0–4.4)
Cholesterol, Total: 157 mg/dL (ref 100–199)
HDL: 70 mg/dL (ref >39)
LDL Chol Calc (NIH): 78 mg/dL (ref 0–99)
Triglycerides: 42 mg/dL (ref 0–149)
VLDL Cholesterol Cal: 9 mg/dL (ref 5–40)

## 2022-06-01 LAB — COMPREHENSIVE METABOLIC PANEL
ALT: 8 IU/L (ref 0–32)
AST: 12 IU/L (ref 0–40)
Albumin/Globulin Ratio: 2.4 — ABNORMAL HIGH (ref 1.2–2.2)
Albumin: 4.8 g/dL (ref 3.9–4.9)
Alkaline Phosphatase: 61 IU/L (ref 44–121)
BUN/Creatinine Ratio: 12 (ref 9–23)
BUN: 12 mg/dL (ref 6–24)
Bilirubin Total: 0.6 mg/dL (ref 0.0–1.2)
CO2: 23 mmol/L (ref 20–29)
Calcium: 9.7 mg/dL (ref 8.7–10.2)
Chloride: 103 mmol/L (ref 96–106)
Creatinine, Ser: 1.02 mg/dL — ABNORMAL HIGH (ref 0.57–1.00)
Globulin, Total: 2 g/dL (ref 1.5–4.5)
Glucose: 68 mg/dL — ABNORMAL LOW (ref 70–99)
Potassium: 4.1 mmol/L (ref 3.5–5.2)
Sodium: 141 mmol/L (ref 134–144)
Total Protein: 6.8 g/dL (ref 6.0–8.5)
eGFR: 70 mL/min/{1.73_m2} (ref >59)

## 2022-06-01 LAB — HEPATITIS B SURFACE ANTIGEN: Hepatitis B Surface Ag: NEGATIVE

## 2022-06-01 LAB — CBC
Hematocrit: 39.3 % (ref 34.0–46.6)
Hemoglobin: 12.8 g/dL (ref 11.1–15.9)
MCH: 29.3 pg (ref 26.6–33.0)
MCHC: 32.6 g/dL (ref 31.5–35.7)
MCV: 90 fL (ref 79–97)
Platelets: 230 10*3/uL (ref 150–450)
RBC: 4.37 x10E6/uL (ref 3.77–5.28)
RDW: 12.3 % (ref 11.7–15.4)
WBC: 5.9 10*3/uL (ref 3.4–10.8)

## 2022-06-01 LAB — TSH: TSH: 0.441 u[IU]/mL — ABNORMAL LOW (ref 0.450–4.500)

## 2022-06-01 LAB — RPR: RPR Ser Ql: NONREACTIVE

## 2022-06-01 LAB — HIV ANTIBODY (ROUTINE TESTING W REFLEX): HIV Screen 4th Generation wRfx: NONREACTIVE

## 2022-06-01 LAB — HEPATITIS C ANTIBODY: Hep C Virus Ab: NONREACTIVE

## 2022-06-04 LAB — CERVICOVAGINAL ANCILLARY ONLY
Chlamydia: NEGATIVE
Comment: NEGATIVE
Comment: NEGATIVE
Comment: NORMAL
Neisseria Gonorrhea: NEGATIVE
Trichomonas: NEGATIVE

## 2022-08-30 ENCOUNTER — Ambulatory Visit
Admission: RE | Admit: 2022-08-30 | Discharge: 2022-08-30 | Disposition: A | Discharge status: Home / Self Care | Payer: Medicare Other | Source: Ambulatory Visit | Attending: Obstetrics and Gynecology | Admitting: Obstetrics and Gynecology

## 2022-08-30 DIAGNOSIS — Z1231 Encounter for screening mammogram for malignant neoplasm of breast: Secondary | ICD-10-CM

## 2022-09-05 IMAGING — MG DIGITAL SCREENING BREAST BILAT IMPLANT W/ TOMO W/ CAD
9 of 12 series · 9 of 28 positions shown · non-contrast
Comparison: None

CLINICAL DATA: Screening.

EXAM:
DIGITAL SCREENING BILATERAL MAMMOGRAM WITH IMPLANTS, CAD AND
TOMOSYNTHESIS
TECHNIQUE: Bilateral screening digital craniocaudal and mediolateral oblique
mammograms were obtained. Bilateral screening digital breast
tomosynthesis was performed. The images were evaluated with
computer-aided detection. Standard and/or implant displaced views
were performed.

[R CC]
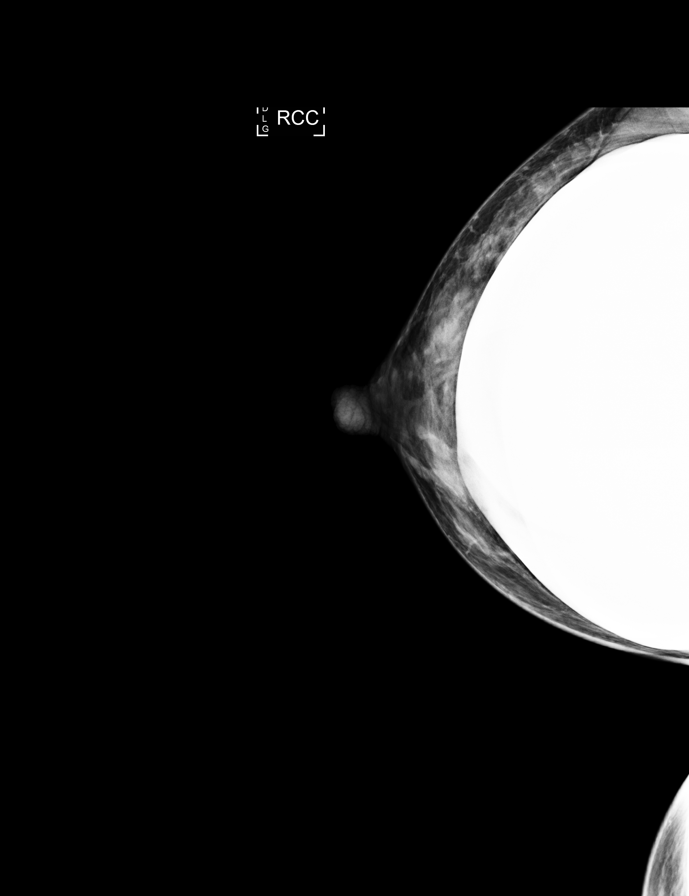

[L MLO]
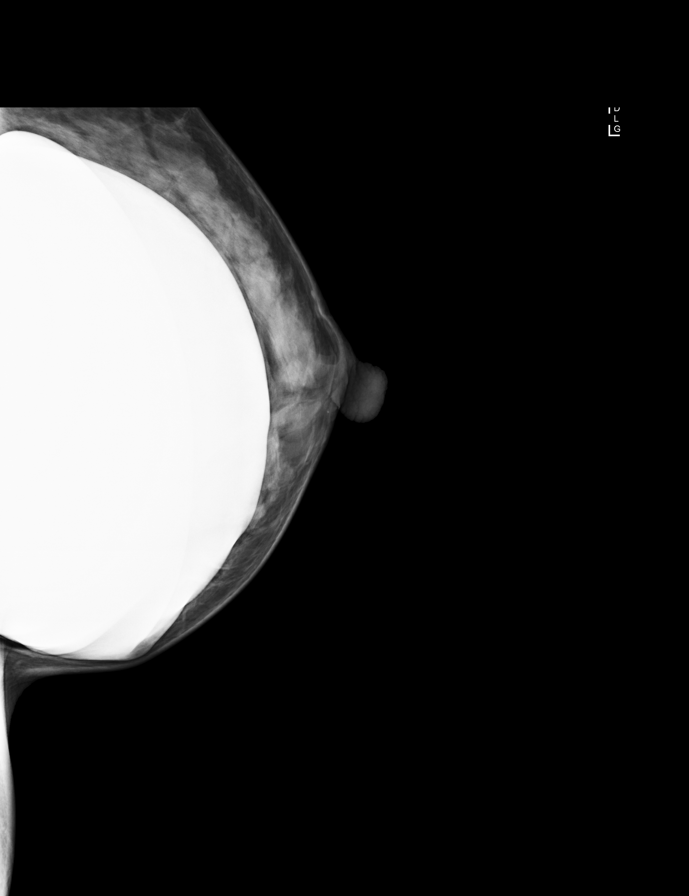

[R MLO]
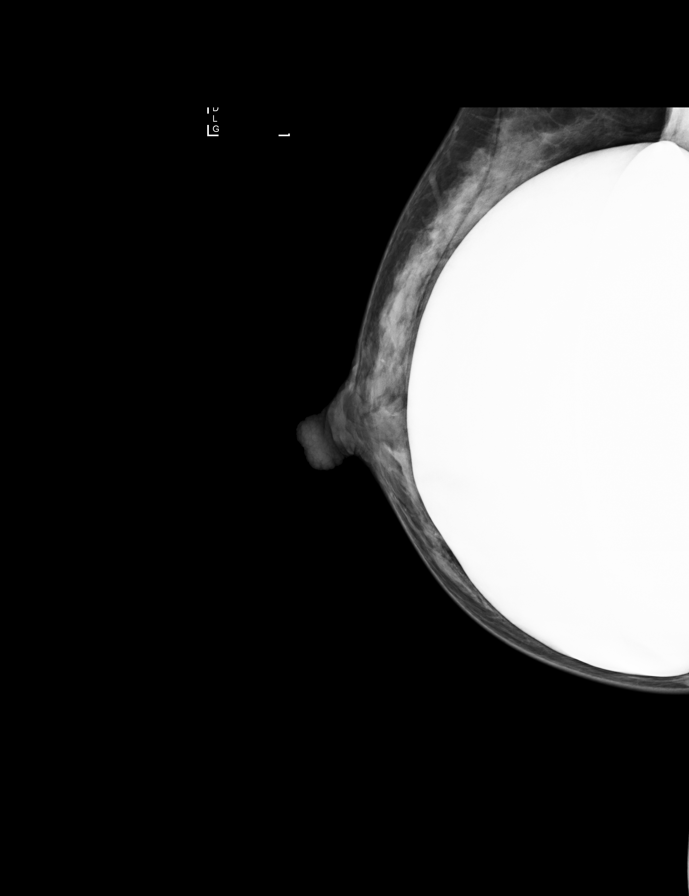

[L CC]
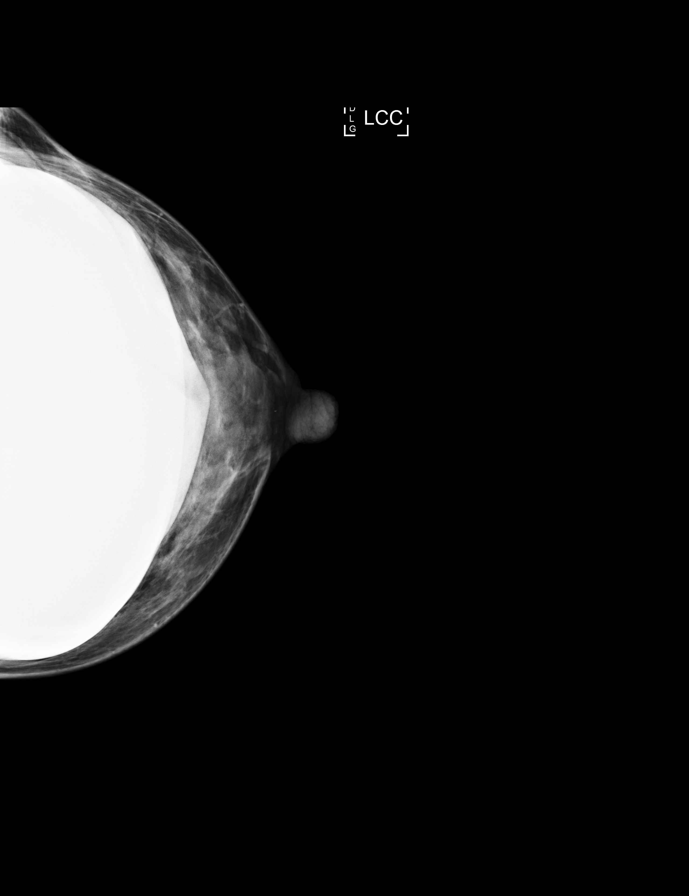

[R MLO synth-2D]
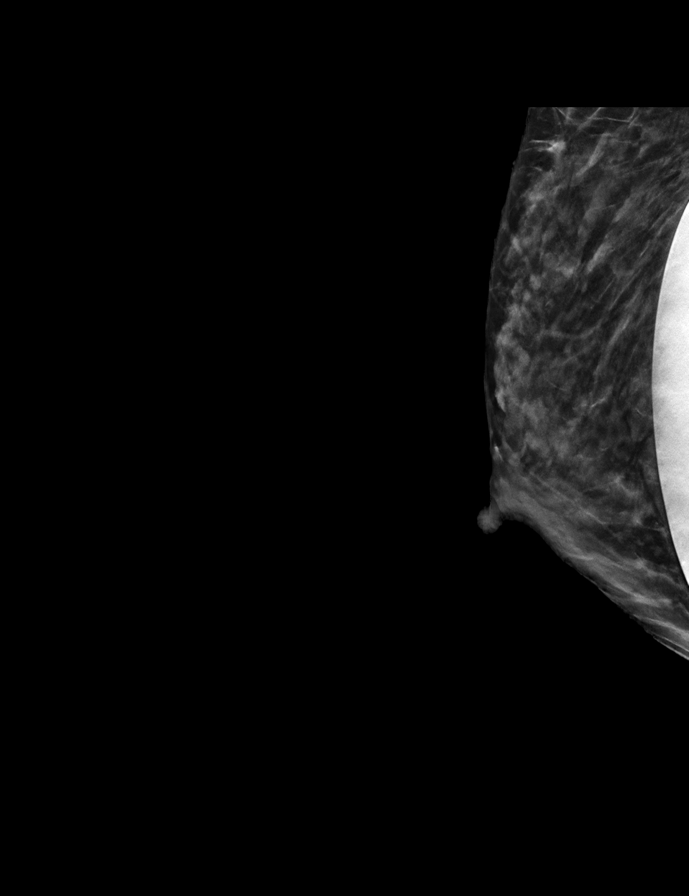

[R CC synth-2D]
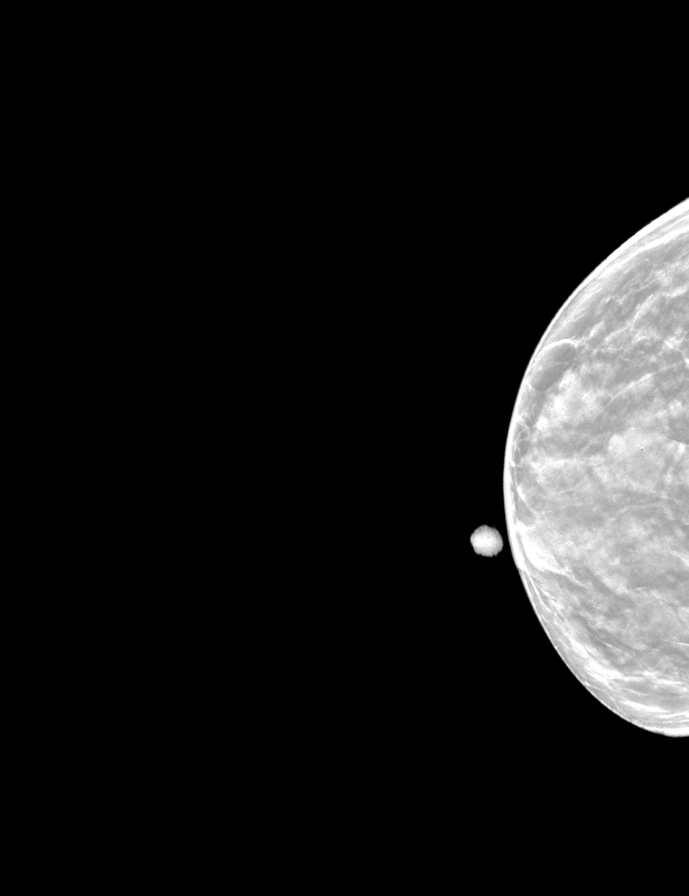

[L CC synth-2D]
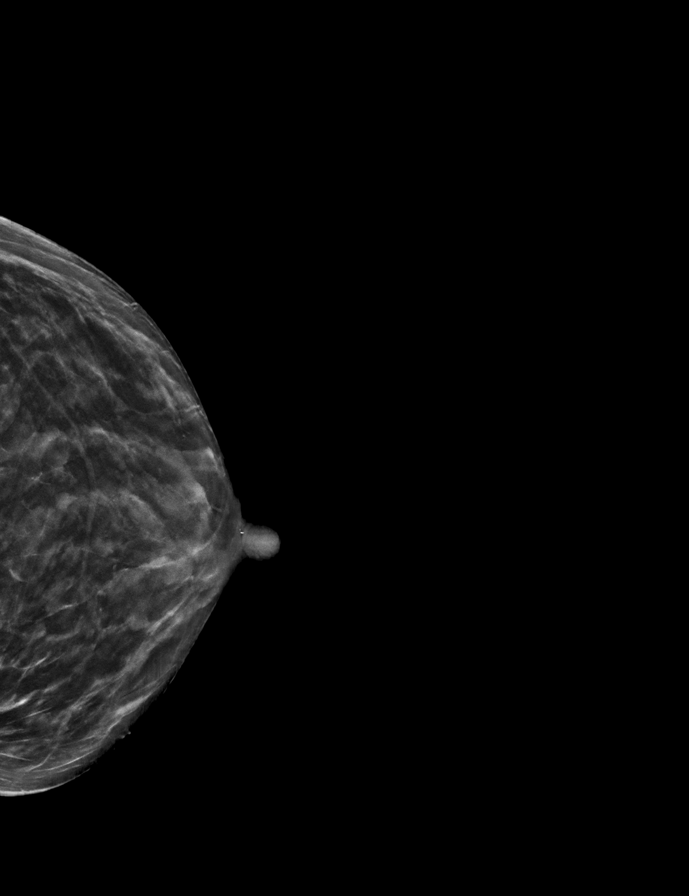

[L MLO synth-2D]
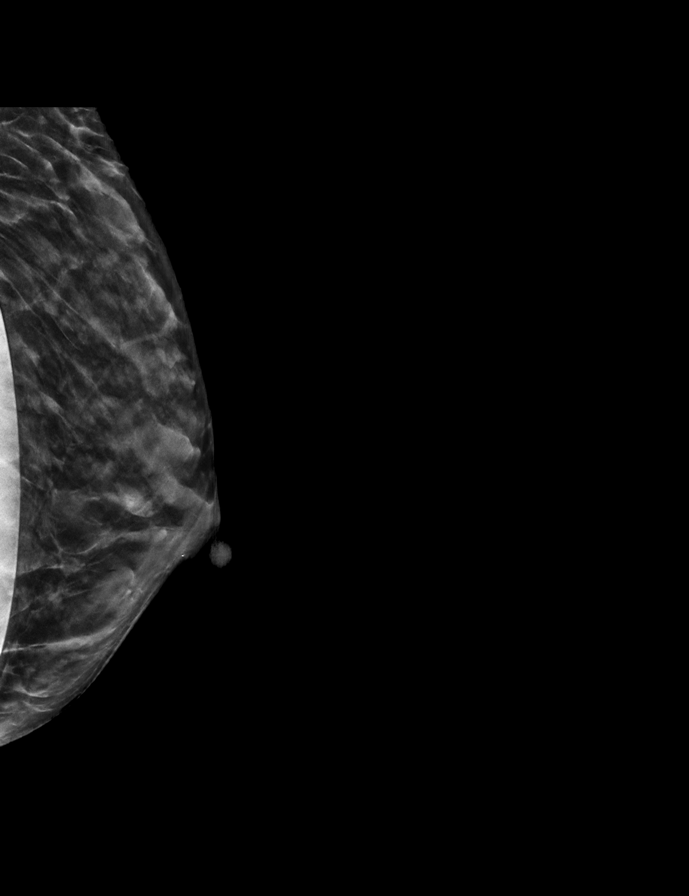

[R MLOID BREAST TOMOSYNTHESIS IMAGE tomo · tomo slice 25/48.0]
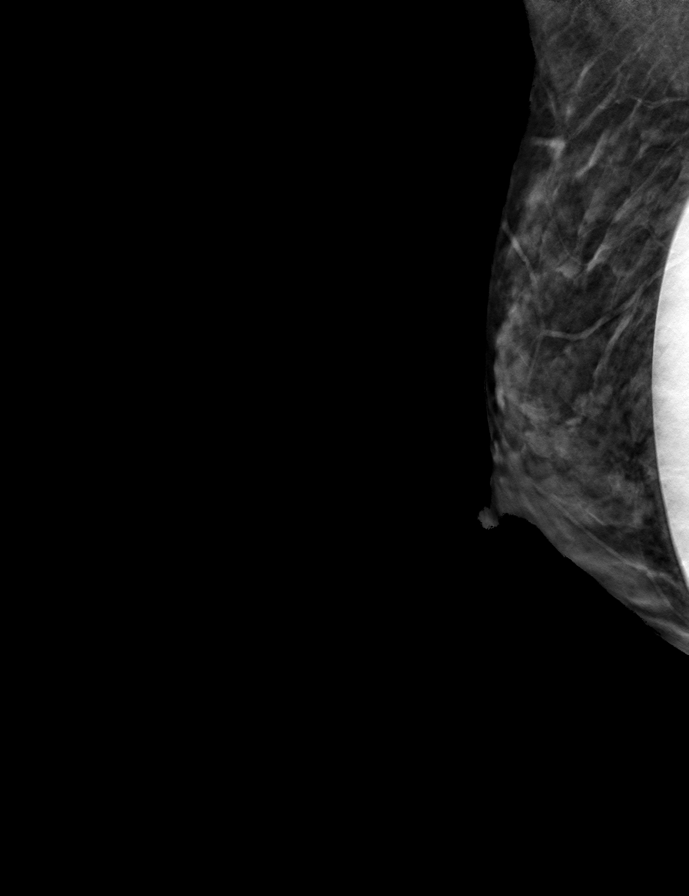

[9 of 28 positions shown; findings below may reference images not displayed]

ACR Breast Density Category c: The breast tissue is heterogeneously
dense, which may obscure small masses.
FINDINGS: The patient has retropectoral implants. In the right breast, a
possible asymmetry warrants further evaluation. In the left breast,
no suspicious masses or malignant type calcifications are
identified.
IMPRESSION: Further evaluation is suggested for possible asymmetry in the right
breast.

RECOMMENDATION:
Diagnostic mammogram and possibly ultrasound of the right breast.
(Code:[id number]-[id number])

The patient will be contacted regarding the findings, and additional
imaging will be scheduled.

BI-RADS CATEGORY  0: Incomplete. Need additional imaging evaluation
and/or prior mammograms for comparison.

## 2022-11-08 ENCOUNTER — Other Ambulatory Visit: Payer: Self-pay | Admitting: Obstetrics and Gynecology

## 2022-11-09 ENCOUNTER — Other Ambulatory Visit: Payer: Self-pay

## 2023-12-15 ENCOUNTER — Other Ambulatory Visit: Payer: Self-pay

## 2023-12-15 ENCOUNTER — Ambulatory Visit (INDEPENDENT_AMBULATORY_CARE_PROVIDER_SITE_OTHER)

## 2023-12-15 ENCOUNTER — Ambulatory Visit: Payer: Self-pay | Admitting: Nurse Practitioner

## 2023-12-15 ENCOUNTER — Ambulatory Visit
Admission: EM | Admit: 2023-12-15 | Discharge: 2023-12-15 | Disposition: A | Discharge status: Home / Self Care | Attending: Family Medicine | Admitting: Family Medicine

## 2023-12-15 DIAGNOSIS — J029 Acute pharyngitis, unspecified: Secondary | ICD-10-CM

## 2023-12-15 DIAGNOSIS — R051 Acute cough: Secondary | ICD-10-CM

## 2023-12-15 DIAGNOSIS — J209 Acute bronchitis, unspecified: Secondary | ICD-10-CM | POA: Diagnosis not present

## 2023-12-15 LAB — POCT RAPID STREP A (OFFICE): Rapid Strep A Screen: NEGATIVE

## 2023-12-15 MED ORDER — BENZONATATE 200 MG PO CAPS
200.0000 mg | ORAL_CAPSULE | Freq: Three times a day (TID) | ORAL | 0 refills | Status: AC | PRN
Start: 2023-12-15 — End: ?

## 2023-12-15 MED ORDER — AMOXICILLIN-POT CLAVULANATE 875-125 MG PO TABS
1.0000 | ORAL_TABLET | Freq: Two times a day (BID) | ORAL | 0 refills | Status: AC
Start: 2023-12-15 — End: ?

## 2023-12-15 MED ORDER — LIDOCAINE VISCOUS HCL 2 % MT SOLN
15.0000 mL | Freq: Four times a day (QID) | OROMUCOSAL | 0 refills | Status: AC | PRN
Start: 2023-12-15 — End: ?

## 2023-12-15 NOTE — Discharge Instructions (Addendum)
 Start Augmentin  antibiotic twice daily for 7 days.  May take Tessalon  3 times a day as needed for your cough.  You may use the lidocaine  gargle as needed for your sore throat.  Please gargle and spit do not swallow.  May use over-the-counter Tylenol ibuprofen  as well as warm liquids such as teas and honey and salt water gargles.  Lots of rest and fluids.  Please follow-up with your PCP if your symptoms do not improve.  Please go to the ER if you develop any worsening symptoms.  Hope you feel better soon!

## 2023-12-15 NOTE — ED Triage Notes (Signed)
 Pt c/o sore throat, dysphagia and dry [id number]
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
